# Patient Record
Sex: Male | Born: 2000 | Race: White | Hispanic: No | Marital: Single | Smoking: Never smoker
Health system: Southern US, Community
[De-identification: ages and names within clinical notes are randomized; demographics above are authoritative.]

## PROBLEM LIST (undated history)

## (undated) DIAGNOSIS — L309 Dermatitis, unspecified: Secondary | ICD-10-CM

## (undated) DIAGNOSIS — E063 Autoimmune thyroiditis: Secondary | ICD-10-CM

## (undated) DIAGNOSIS — E669 Obesity, unspecified: Secondary | ICD-10-CM

## (undated) DIAGNOSIS — E301 Precocious puberty: Secondary | ICD-10-CM

## (undated) DIAGNOSIS — R7303 Prediabetes: Secondary | ICD-10-CM

## (undated) DIAGNOSIS — Z8489 Family history of other specified conditions: Secondary | ICD-10-CM

## (undated) DIAGNOSIS — I10 Essential (primary) hypertension: Secondary | ICD-10-CM

## (undated) DIAGNOSIS — J329 Chronic sinusitis, unspecified: Secondary | ICD-10-CM

## (undated) HISTORY — DX: Prediabetes: R73.03

## (undated) HISTORY — DX: Obesity, unspecified: E66.9

---
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
## 2021-01-02 ENCOUNTER — Ambulatory Visit (INDEPENDENT_AMBULATORY_CARE_PROVIDER_SITE_OTHER): Payer: 59 | Admitting: Gastroenterology

## 2021-01-02 ENCOUNTER — Encounter: Payer: Self-pay | Admitting: Gastroenterology

## 2021-01-02 VITALS — BP 138/74 | HR 103 | Ht 71.0 in | Wt 314.4 lb

## 2021-01-02 DIAGNOSIS — K760 Fatty (change of) liver, not elsewhere classified: Secondary | ICD-10-CM | POA: Diagnosis not present

## 2021-01-02 DIAGNOSIS — K219 Gastro-esophageal reflux disease without esophagitis: Secondary | ICD-10-CM | POA: Diagnosis not present

## 2021-01-02 NOTE — Progress Notes (Signed)
[facility] Progress Note  Chief Complaint: GERD and fatty liver  Subjective  History: Seen in consult 10/21/2020 for chronic GERD and elevated LFTs with imaging showing fatty liver.  Patient was started on pantoprazole 40 mg a day, diet and lifestyle changes recommended both the GERD and fatty liver.  Justin [name] has been feeling better since we last saw him.  Daily pantoprazole has resolved his heartburn.  He does not feel regurgitation and denies dysphagia.  He is trying to make improvements in his diet and lifestyle.  He is also planning to see a rheumatologist soon because of his elevated ANA and family history of lupus.  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  Incidentally, he is heading to [facility] [employer] in [place], [place] next year and will play lacrosse there as well.  ROS: Cardiovascular:  no chest pain Respiratory: no dyspnea No rash  The patient's Past Medical, Family and Social History were reviewed and are on file in the EMR.  Objective:  Med list reviewed  Current Outpatient Medications:  .  albuterol (PROAIR HFA) 108 (90 Base) MCG/ACT inhaler, USE 2 INHALATIONS BY MOUTH  EVERY 4 HOURS AS NEEDED FOR WHEEZING OR SHORTNESS OF  BREATH, Disp: 54 g, Rfl: 2 .  albuterol (PROVENTIL) (2.5 MG/3ML) 0.083% nebulizer solution, USE 1 VIAL VIA NEBULIZER  EVERY 6 HOURS AS NEEDED FOR WHEEZING OR SHORTNESS OF  BREATH, Disp: 450 mL, Rfl: 0 .  ARNUITY ELLIPTA 200 MCG/ACT AEPB, USE 1 INHALATION BY MOUTH  DAILY. RINSE, GARGLE AND  SPIT AFTER USE, Disp: 90 each, Rfl: 10 .  azelastine (ASTELIN) 0.1 % nasal spray, Place 2 sprays into both nostrils 2 (two) times daily., Disp: 90 mL, Rfl: 2 .  calcium-vitamin D (OSCAL WITH D) 500-200 MG-UNIT tablet, Take 1 tablet by mouth daily., Disp: , Rfl:  .  desonide (DESOWEN) 0.05 % cream, APPLY TOPICALLY TWICE DAILY, Disp: 180 g, Rfl: 2 .  levothyroxine  (SYNTHROID) 150 MCG tablet, Take 1 tablet (150 mcg total) by mouth daily., Disp: 90 tablet, Rfl: 3 .  lisinopril (ZESTRIL) 5 MG tablet, Take 1 tablet (5 mg total) by mouth daily., Disp: 90 tablet, Rfl: 3 .  loratadine (CLARITIN) 10 MG tablet, Take 1 tablet (10 mg total) by mouth 2 (two) times daily., Disp: 180 tablet, Rfl: 2 .  loteprednol (LOTEMAX) 0.2 % SUSP, Place 1 drop into both eyes 2 (two) times daily., Disp: 3 Bottle, Rfl: 2 .  olopatadine (PATANOL) 0.1 % ophthalmic solution, INSTILL 1 DROP INTO BOTH  EYES TWICE DAILY IF NEEDED, Disp: 15 mL, Rfl: 10 .  pantoprazole (PROTONIX) 40 MG tablet, Take 1 tablet (40 mg total) by mouth daily., Disp: 30 tablet, Rfl: 8 .  PATADAY 0.2 % SOLN, Place 1 drop into both eyes daily., Disp: 2.5 mL, Rfl: 5   Vital signs in last 24 hrs: Vitals:   01/02/21 1350  BP: 138/74  Pulse: (!) 103  SpO2: 98%   Wt Readings from Last 3 Encounters:  01/02/21 (!) 314 lb 6.4 oz (142.6 kg) (>99 %, Z= 3.18)*  11/07/20 (!) 304 lb 9.6 oz (138.2 kg) (>99 %, Z= 3.09)*  10/21/20 (!) 302 lb 6.4 oz (137.2 kg) (>99 %, Z= 3.07)*   * Growth percentiles are based on CDC (Boys, 2-20 Years) data.    Physical Exam  Well-appearing  HEENT: sclera anicteric, oral mucosa moist without  lesions  Neck: supple, no thyromegaly, JVD or lymphadenopathy  Cardiac: RRR without murmurs, S1S2 heard, no peripheral edema  Pulm: clear to auscultation bilaterally, normal RR and effort noted  Abdomen: soft, morbidly obese, no tenderness, with active bowel sounds. No guarding or palpable hepatosplenomegaly.  Skin; warm and dry, no jaundice or rash  Labs:  ESR 8, ANA positive at 1-3 20, hemoglobin A1c 5.6, smooth muscle antibody negative  Last LFTs normal 10/21/2020 ___________________________________________ Radiologic studies:   ____________________________________________ Other:   _____________________________________________ Assessment & Plan  Assessment: Encounter Diagnoses   Name Primary?  . Gastroesophageal reflux disease, unspecified whether esophagitis present Yes  . NAFLD (nonalcoholic fatty liver disease)    GERD with no red flag symptoms.  Much improved on once daily PPI. We discussed the limitation of acid suppression therapy, breadth of diet and lifestyle changes required for reflux control, the possibility of reflux related complications such as esophagitis, stricture or Barrett's esophagus.  Other anatomic considerations such as gastric outlet obstruction or hiatal hernia may contribute to reflux.  I offered a referral to the [facility] given his BMI and comorbidity of fatty liver.  He appreciated the offer and did not want to pursue that at this time.  He said he would talk with his mother some more about it  Hopefully the chronic PPI use can be a bridge to better improvement in his GERD symptoms through diet and lifestyle changes, especially significant weight loss.  We will see him again as needed.  20 minutes were spent on this encounter (including chart review, history/exam, counseling/coordination of care, and documentation) > 50% of that time was spent on counseling and coordination of care.  Topics discussed included: GERD and fatty liver.  Nelida Meuse III

## 2021-01-02 NOTE — Patient Instructions (Signed)
If you are age 19 or older, your body mass index should be between 23-30. Your Body mass index is 43.85 kg/m. If this is out of the aforementioned range listed, please consider follow up with your Primary Care Provider.  If you are age 31 or younger, your body mass index should be between 19-25. Your Body mass index is 43.85 kg/m. If this is out of the aformentioned range listed, please consider follow up with your Primary Care Provider.   It was a pleasure to see you today!  Dr. Myrtie Neither

## 2021-05-06 ENCOUNTER — Other Ambulatory Visit: Payer: Self-pay | Admitting: Allergy and Immunology

## 2021-05-08 ENCOUNTER — Other Ambulatory Visit: Payer: Self-pay

## 2021-05-08 MED ORDER — ALBUTEROL SULFATE HFA 108 (90 BASE) MCG/ACT IN AERS: INHALATION_SPRAY | RESPIRATORY_TRACT | 2 refills | Status: DC

## 2021-06-09 ENCOUNTER — Other Ambulatory Visit: Payer: Self-pay | Admitting: Allergy and Immunology

## 2021-06-24 ENCOUNTER — Encounter: Payer: Self-pay | Admitting: Allergy and Immunology

## 2021-06-24 ENCOUNTER — Other Ambulatory Visit: Payer: Self-pay

## 2021-06-24 ENCOUNTER — Ambulatory Visit (INDEPENDENT_AMBULATORY_CARE_PROVIDER_SITE_OTHER): Payer: 59 | Admitting: Allergy and Immunology

## 2021-06-24 VITALS — BP 130/72 | HR 97 | Temp 98.2°F | Resp 16 | Ht 71.0 in | Wt 321.0 lb

## 2021-06-24 DIAGNOSIS — J3089 Other allergic rhinitis: Secondary | ICD-10-CM | POA: Diagnosis not present

## 2021-06-24 DIAGNOSIS — H1013 Acute atopic conjunctivitis, bilateral: Secondary | ICD-10-CM

## 2021-06-24 DIAGNOSIS — H1010 Acute atopic conjunctivitis, unspecified eye: Secondary | ICD-10-CM

## 2021-06-24 DIAGNOSIS — J301 Allergic rhinitis due to pollen: Secondary | ICD-10-CM | POA: Diagnosis not present

## 2021-06-24 DIAGNOSIS — L2089 Other atopic dermatitis: Secondary | ICD-10-CM

## 2021-06-24 DIAGNOSIS — J4530 Mild persistent asthma, uncomplicated: Secondary | ICD-10-CM | POA: Diagnosis not present

## 2021-06-24 MED ORDER — LORATADINE 10 MG PO TABS: 10.0000 mg | ORAL_TABLET | Freq: Two times a day (BID) | ORAL | 2 refills | Status: DC

## 2021-06-24 MED ORDER — ARNUITY ELLIPTA 200 MCG/ACT IN AEPB: INHALATION_SPRAY | RESPIRATORY_TRACT | 5 refills | Status: DC

## 2021-06-24 MED ORDER — ALBUTEROL SULFATE HFA 108 (90 BASE) MCG/ACT IN AERS: INHALATION_SPRAY | RESPIRATORY_TRACT | 2 refills | Status: DC

## 2021-06-24 MED ORDER — MOMETASONE FUROATE 0.1 % EX OINT: TOPICAL_OINTMENT | Freq: Every day | CUTANEOUS | 3 refills | Status: DC

## 2021-06-24 NOTE — Progress Notes (Signed)
[place] - [place] - [place] - [place] - [place]   Follow-up Note  Referring Provider: [name], MD Primary Provider: [name], MD Date of Office Visit: 06/24/2021  Subjective:   Justin [name] (DOB: 05-05-2001) is a 20 y.o. male who [facility] in re-evaluation of the following:  HPI: Justin [name] returns to this clinic in reevaluation of asthma and allergic rhinitis and atopic dermatitis.  His last visit to this clinic was 27 August 2020.  He has really done well with his asthma and basically his entire airway is under excellent control with his current plan and he has not required a systemic steroid or antibiotic and he rarely uses a short acting bronchodilator and he can exercise without any problem at all.  He has been having some problems with his skin.  He is now working at [employer] and is developing a problem on his forearms and his popliteal fossa.  He apparently was exposed to a fair amount of chemicals while working at [employer].  His recent outbreak has been about 4 months or so.  He has been applying some mupirocin to his right wrist and it does appear to respond to some degree.  He is not using any topical steroid.  Allergies as of 06/24/2021       Reactions   Adhesive [tape] Rash   EXACERBATES ECZEMA        Medication List    albuterol (2.5 MG/3ML) 0.083% nebulizer solution Commonly known as: PROVENTIL USE 1 VIAL VIA NEBULIZER  EVERY 6 HOURS AS NEEDED FOR WHEEZING OR SHORTNESS OF  BREATH   albuterol 108 (90 Base) MCG/ACT inhaler Commonly known as: ProAir HFA USE 2 INHALATIONS BY MOUTH  EVERY 4 HOURS AS NEEDED FOR WHEEZING OR SHORTNESS OF  BREATH   Arnuity Ellipta 200 MCG/ACT Aepb Generic drug: Fluticasone Furoate USE 1 INHALATION BY MOUTH  DAILY. RINSE, GARGLE AND  SPIT AFTER USE   calcium-vitamin D 500-200 MG-UNIT tablet Commonly known as: OSCAL WITH D Take 1 tablet by mouth daily.    desonide 0.05 % cream Commonly known as: DESOWEN APPLY TOPICALLY TWICE DAILY   levothyroxine 150 MCG tablet Commonly known as: SYNTHROID Take 1 tablet (150 mcg total) by mouth daily.   lisinopril 5 MG tablet Commonly known as: ZESTRIL Take 1 tablet (5 mg total) by mouth daily.   loratadine 10 MG tablet Commonly known as: CLARITIN Take 1 tablet (10 mg total) by mouth 2 (two) times daily.   loteprednol 0.2 % Susp Commonly known as: LOTEMAX Place 1 drop into both eyes 2 (two) times daily.   pantoprazole 40 MG tablet Commonly known as: PROTONIX Take 1 tablet (40 mg total) by mouth daily.   Pataday 0.2 % Soln Generic drug: Olopatadine HCl Place 1 drop into both eyes daily.   olopatadine 0.1 % ophthalmic solution Commonly known as: PATANOL INSTILL 1 DROP INTO BOTH  EYES TWICE DAILY IF NEEDED        Past Medical History:  Diagnosis Date   Asthma    daily inhaler, prn neb./inhaler   Eczema    Family history of adverse reaction to anesthesia    pt's father had hx. of being hard to wake up post-op   Hashimoto's disease    Hypertension    under control with med., has been on med. x 1 yr.   Obesity    Precocious puberty 10/2016   Prediabetes    Sinus infection  10/20/2016   started antibiotic 10/14/2016 x 10 days    Past Surgical History:  Procedure Laterality Date   MYRINGOTOMY WITH TUBE PLACEMENT  05/24/2003   SUPPRELIN IMPLANT Left 02/07/2015   Procedure: SUPPRELIN IMPLANT;  Surgeon: Judie Petit. Leonia Corona, MD;  Location: [facility];  Service: Pediatrics;  Laterality: Left;   SUPPRELIN IMPLANT  2015   SUPPRELIN IMPLANT Left 10/26/2016   Procedure: REMOVE SUPPRELIN IMPLANT;  Surgeon: Kandice Hams, MD;  Location: [facility];  Service: Pediatrics;  Laterality: Left;   SUPPRELIN REMOVAL Left 02/07/2015   Procedure: SUPPRELIN REMOVAL;  Surgeon: Judie Petit. Leonia Corona, MD;  Location: [facility];  Service: Pediatrics;  Laterality: Left;    TOOTH EXTRACTION Bilateral 01/02/2015   Procedure: SURGICAL REMOVAL OF TEETH--1,16,17,32;  Surgeon: Hinton Dyer, DDS;  Location: [facility];  Service: Oral Surgery;  Laterality: Bilateral;    Review of systems negative except as noted in HPI / PMHx or noted below:  Review of Systems  Constitutional: Negative.   HENT: Negative.    Eyes: Negative.   Respiratory: Negative.    Cardiovascular: Negative.   Gastrointestinal: Negative.   Genitourinary: Negative.   Musculoskeletal: Negative.   Skin: Negative.   Neurological: Negative.   Endo/Heme/Allergies: Negative.   Psychiatric/Behavioral: Negative.      Objective:   Vitals:   06/24/21 1337  BP: 130/72  Pulse: 97  Resp: 16  Temp: 98.2 F (36.8 C)  SpO2: 99%   Height: 5\' 11"  (180.3 cm)  Weight: (!) 321 lb (145.6 kg)   Physical Exam Constitutional:      Appearance: He is not diaphoretic.  HENT:     Head: Normocephalic.     Right Ear: Tympanic membrane, ear canal and external ear normal.     Left Ear: Tympanic membrane, ear canal and external ear normal.     Nose: Nose normal. No mucosal edema or rhinorrhea.     Mouth/Throat:     Pharynx: Uvula midline. No oropharyngeal exudate.  Eyes:     Conjunctiva/sclera: Conjunctivae normal.  Neck:     Thyroid: No thyromegaly.     Trachea: Trachea normal. No tracheal tenderness or tracheal deviation.  Cardiovascular:     Rate and Rhythm: Normal rate and regular rhythm.     Heart sounds: Normal heart sounds, S1 normal and S2 normal. No murmur heard. Pulmonary:     Effort: No respiratory distress.     Breath sounds: Normal breath sounds. No stridor. No wheezing or rales.  Lymphadenopathy:     Head:     Right side of head: No tonsillar adenopathy.     Left side of head: No tonsillar adenopathy.     Cervical: No cervical adenopathy.  Skin:    Findings: Rash (Blotchy erythematous patches forearms right popliteal fossa.  7 x 7 cm erythematous lichenified patch  dorsal surface right wrist) present. No erythema.     Nails: There is no clubbing.  Neurological:     Mental Status: He is alert.    Diagnostics:    Spirometry was performed and demonstrated an FEV1 of 4.44 at 93 % of predicted.  Assessment and Plan:   1. Asthma, well controlled, mild persistent   2. Perennial allergic rhinitis   3. Seasonal allergic rhinitis due to pollen   4. Seasonal allergic conjunctivitis   5. Other atopic dermatitis    1. Continue to Treat inflammation:    A. Arnuity 200- 1 inhalation 3-7 times a week  B. Flonase 1-2 sprays each nostril 3-7 times a week  C. Montelukast 10 mg - 1 tablet 3-7 times a week  2. If needed:   A. Proventil HFA 2 puffs every 4-6 hours  B. loratadine 10 mg 1- 2 tablets one time per day  C. Pataday 1 drop each eye once a day  D. Mometasone 0.1% ointment applied to inflamed skin 1-2 times per day  3. Obtain fall flu vaccine  4. Return to clinic in 12 months or earlier if problem   Justin [name] has done very well with his airway and I think there is an opportunity to consolidate his medical treatment and he can use his anti-inflammatory agents for his airway somewhere between 3-7 times per week depending on disease activity.  His skin appears to be flared and this is probably secondary to chemical exposure at his place of employment and we will get him to use mometasone 0.1% ointment applied to his skin until this resolves.  Should he not respond to this agent especially regarding the dorsal right wrist lesion then we need to consider the possibility has a secondary fungal infection.  If he does well I will see him back in this clinic in 12 months or earlier if there is a problem.   Laurette Schimke, MD Allergy / Immunology [facility]

## 2021-06-24 NOTE — Patient Instructions (Signed)
  1. Continue to Treat inflammation:    A. Arnuity 200- 1 inhalation 3-7 times a week  B. Flonase 1-2 sprays each nostril 3-7 times a week  C. Montelukast 10 mg - 1 tablet 3-7 times a week  2. If needed:   A. Proventil HFA 2 puffs every 4-6 hours  B. loratadine 10 mg 1- 2 tablets one time per day  C. Pataday 1 drop each eye once a day  D.  Mometasone 0.1% ointment applied to inflamed skin 1-2 times per day  3. Obtain fall flu vaccine  4. Return to clinic in 12 months or earlier if problem

## 2021-06-25 ENCOUNTER — Encounter: Payer: Self-pay | Admitting: Allergy and Immunology

## 2021-06-26 ENCOUNTER — Other Ambulatory Visit: Payer: Self-pay | Admitting: *Deleted

## 2021-06-26 MED ORDER — ARNUITY ELLIPTA 200 MCG/ACT IN AEPB: 1.0000 | INHALATION_SPRAY | Freq: Every day | RESPIRATORY_TRACT | 1 refills | Status: DC

## 2021-07-07 ENCOUNTER — Telehealth: Payer: Self-pay | Admitting: Gastroenterology

## 2021-07-07 MED ORDER — PANTOPRAZOLE SODIUM 40 MG PO TBEC: 40.0000 mg | DELAYED_RELEASE_TABLET | Freq: Every day | ORAL | 1 refills | Status: AC

## 2021-07-07 NOTE — Telephone Encounter (Signed)
Please send a 90 day prescription for once daily therapy and one refill to his preferred pharmacy.  - HD

## 2021-07-07 NOTE — Telephone Encounter (Signed)
Please advise on amount of refills that can be provided

## 2021-07-07 NOTE — Telephone Encounter (Signed)
Refilled as directed for a 90 days supply with 1 refill.

## 2021-07-09 ENCOUNTER — Other Ambulatory Visit: Payer: Self-pay | Admitting: Allergy and Immunology

## 2021-07-19 ENCOUNTER — Other Ambulatory Visit: Payer: Self-pay | Admitting: Allergy and Immunology

## 2021-07-22 ENCOUNTER — Ambulatory Visit (INDEPENDENT_AMBULATORY_CARE_PROVIDER_SITE_OTHER): Payer: 59 | Admitting: Pediatric Endocrinology

## 2021-07-22 ENCOUNTER — Other Ambulatory Visit: Payer: Self-pay

## 2021-07-22 ENCOUNTER — Encounter (INDEPENDENT_AMBULATORY_CARE_PROVIDER_SITE_OTHER): Payer: Self-pay | Admitting: Pediatric Endocrinology

## 2021-07-22 VITALS — BP 110/60 | HR 84 | Wt 327.4 lb

## 2021-07-22 DIAGNOSIS — E559 Vitamin D deficiency, unspecified: Secondary | ICD-10-CM | POA: Diagnosis not present

## 2021-07-22 DIAGNOSIS — R7303 Prediabetes: Secondary | ICD-10-CM

## 2021-07-22 DIAGNOSIS — E063 Autoimmune thyroiditis: Secondary | ICD-10-CM

## 2021-07-22 DIAGNOSIS — E038 Other specified hypothyroidism: Secondary | ICD-10-CM

## 2021-07-22 LAB — POCT GLYCOSYLATED HEMOGLOBIN (HGB A1C): Hemoglobin A1C: 5.7 % — AB (ref 4.0–5.6)

## 2021-07-22 LAB — POCT GLUCOSE (DEVICE FOR HOME USE): POC Glucose: 147 mg/dl — AB (ref 70–99)

## 2021-07-22 MED ORDER — LEVOTHYROXINE SODIUM 150 MCG PO TABS: 150.0000 ug | ORAL_TABLET | Freq: Every day | ORAL | 3 refills | Status: DC

## 2021-07-22 NOTE — Progress Notes (Signed)
Subjective:  Subjective  Patient Name: Christopher Glasscock Date of Birth: 2001-09-19  MRN: [id number]  Michae Grimley  presents to clinic today for follow-up evaluation and management  of his hypothyroidism, prediabetes, and obesity  HISTORY OF PRESENT ILLNESS:   Darnel is a 20 y.o. Caucasian male .  Haruki was unaccompanied   1. [name] was first seen in our clinic 07/16/11 for evaluation and management of hypothyroidism and obesity. The patient was 9[birth year]11[birth year] years old. The patient had developed obesity gradually, but progressively for several years. Breast tissue has developed more recently. He has also had problems with excess hunger and reflux. He is taking both Riomet and Prevacid. [name] diagnosed him with hypothyroidism in March 2012 and started him on  levothyroxine, 25 mcg/day on 03/03/11. The child has not had thyroid surgery or neck irradiation. He has had problems with asthma and allergies for many years. His sister has similar problems with obesity, dyspepsia, GERD, and hypothryoidism secondary to thyroiditis.  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  It was replaced February 07, 2015. It was removed on 10/26/16.     2. The patient's last [facility] visit was on 11/07/20. In the interim, he has been generally healthy. He is on pantoprazole and no longer having reflux.    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 He is lifting 3-4 days a week around working more than full time at [employer]. He is saving money for school.    Thyroid  - He is taking his Synthroid 150 mcg daily.  - He has not been forgetting doses.  - He is taking it in morning-  - His energy level is good - He has been weight training at home 3-4 days a week.  - No constipation or diarrhea - No temperature  dysregulation - Weight gain muscle mass  Blood pressure - He is taking Lisinopril daily  He feels that it is working.  - He does not have a cuff at home.   Prediabetes - No longer taking Metformin - Feels that weight gain is largely muscle mass - He is drinking lots of water  Hypovitamin D - He has switched to gummy vit d and doesn't feel like he refluxes anymore. He is taking 2000 (50 mcg) IU per day.   He feels like he is still growing  3. Pertinent Review of Systems:   Constitutional: The patient feels "great". The patient seems healthy and active.  Eyes: Vision seems to be good. There are no recognized eye problems. Ophthalmology evaluation today (07/22/21) with retinal imaging- no issues.  Neck: There are no recognized problems of the anterior neck.  Heart: There are no recognized heart problems. The ability to play and do other physical activities seems normal.  Lungs: Asthma well controlled. - he has been having seasonal allergies but no major lung concerns.  Gastrointestinal: Bowel movents seem normal. There are no recognized GI problems.  Legs: Muscle mass and strength seem normal. The child can play and perform other physical activities without obvious discomfort. No edema is noted. Growing pains occasionally   Feet: There are no obvious foot problems. No edema is noted. Neurologic: There are no recognized problems with muscle movement and strength, sensation, or coordination.    PAST MEDICAL,  FAMILY, AND SOCIAL HISTORY  Past Medical History:  Diagnosis Date   Asthma    daily inhaler, prn neb./inhaler   Eczema    Family history of adverse reaction to anesthesia    pt's father had hx. of being hard to wake up post-op   Hashimoto's disease    Hypertension    under control with med., has been on med. x 1 yr.   Obesity    Precocious puberty 10/2016   Prediabetes    Sinus infection 10/20/2016   started antibiotic 10/14/2016 x 10 days    Family History  Problem  Relation Age of Onset   Diabetes Mother    Hypertension Father    Anesthesia problems Father        hard to wake up post-op   Asthma Father    Colon cancer Father    Asthma Sister    Heart disease Maternal Grandfather    Asthma Brother    Heart disease Maternal Grandmother    Heart disease Paternal Grandmother    Heart disease Paternal Grandfather    Liver disease Neg Hx    Esophageal cancer Neg Hx    Pancreatic cancer Neg Hx    Stomach cancer Neg Hx      Current Outpatient Medications:    albuterol (PROAIR HFA) 108 (90 Base) MCG/ACT inhaler, USE 2 INHALATIONS BY MOUTH  EVERY 4 HOURS AS NEEDED FOR WHEEZING OR SHORTNESS OF  BREATH, Disp: 54 g, Rfl: 2   albuterol (PROVENTIL) (2.5 MG/3ML) 0.083% nebulizer solution, USE 1 VIAL VIA NEBULIZER  EVERY 6 HOURS AS NEEDED FOR WHEEZING OR SHORTNESS OF  BREATH, Disp: 450 mL, Rfl: 0   Azelastine HCl 137 MCG/SPRAY SOLN, Place into both nostrils., Disp: , Rfl:    calcium-vitamin D (OSCAL WITH D) 500-200 MG-UNIT tablet, Take 1 tablet by mouth daily., Disp: , Rfl:    desonide (DESOWEN) 0.05 % cream, APPLY TOPICALLY TWICE DAILY, Disp: 180 g, Rfl: 1   fluticasone (FLONASE) 50 MCG/ACT nasal spray, USE 1 TO 2 SPRAYS IN BOTH  NOSTRILS ONCE DAILY AS  DIRECTED, Disp: 48 g, Rfl: 1   Fluticasone Furoate (ARNUITY ELLIPTA) 200 MCG/ACT AEPB, Inhale 1 puff into the lungs daily. Use as directed by Dr. Lucie LeatherKozlow., Disp: 90 each, Rfl: 1   lisinopril (ZESTRIL) 5 MG tablet, Take 1 tablet (5 mg total) by mouth daily., Disp: 90 tablet, Rfl: 3   loratadine (CLARITIN) 10 MG tablet, TAKE 1 TABLET(10 MG) BY MOUTH TWICE DAILY, Disp: 180 tablet, Rfl: 2   loteprednol (LOTEMAX) 0.2 % SUSP, Place 1 drop into both eyes 2 (two) times daily., Disp: 3 Bottle, Rfl: 2   mometasone (ELOCON) 0.1 % ointment, Apply topically daily. Apply 1-2 times a day as needed, Disp: 45 g, Rfl: 3   montelukast (SINGULAIR) 10 MG tablet, Take 10 mg by mouth daily., Disp: , Rfl:    olopatadine (PATANOL) 0.1 %  ophthalmic solution, INSTILL 1 DROP INTO BOTH  EYES TWICE DAILY IF NEEDED, Disp: 15 mL, Rfl: 10   pantoprazole (PROTONIX) 40 MG tablet, Take 1 tablet (40 mg total) by mouth daily., Disp: 90 tablet, Rfl: 1   PATADAY 0.2 % SOLN, Place 1 drop into both eyes daily., Disp: 2.5 mL, Rfl: 5   levothyroxine (SYNTHROID) 150 MCG tablet, Take 1 tablet (150 mcg total) by mouth daily., Disp: 90 tablet, Rfl: 3  Allergies as of 07/22/2021 - Review Complete 07/22/2021  Allergen Reaction Noted   Adhesive [tape] Rash 10/20/2016  reports that he has never smoked. He has never used smokeless tobacco. He reports that he does not drink alcohol and does not use drugs. Pediatric History  Patient Parents   Koo,Dana (Mother)   Other Topics Concern   Not on file  Social History Narrative   Freshman at [employer] and working at [employer]   Starting college next week- [employer] training/ [employer]   Primary Care Provider: [name], MD  ROS: There are no other significant problems involving Chelsea's other body systems.     Objective:  Objective  Vital Signs:      11/07/2020  BP 114/60  Pulse 82  Weight 304 lb 9.6 oz (A)  Height 5\' 11"  (1.803 m)  BMI (Calculated) 42.5    BP 110/60   Pulse 84   Wt (!) 327 lb 6.4 oz (148.5 kg)   BMI 45.66 kg/m   Blood pressure percentiles are not available for patients who are 18 years or older.  Ht Readings from Last 3 Encounters:  06/24/21 5\' 11"  (1.803 m) (70 %, Z= 0.53)*  01/02/21 5\' 11"  (1.803 m) (71 %, Z= 0.56)*  11/07/20 5\' 11"  (1.803 m) (72 %, Z= 0.57)*   * Growth percentiles are based on CDC (Boys, 2-20 Years) data.   Wt Readings from Last 3 Encounters:  07/22/21 (!) 327 lb 6.4 oz (148.5 kg) (>99 %, Z= 3.30)*  06/24/21 (!) 321 lb (145.6 kg) (>99 %, Z= 3.24)*  01/02/21 (!) 314 lb 6.4 oz (142.6 kg) (>99 %, Z= 3.18)*   * Growth percentiles are based on CDC (Boys, 2-20 Years) data.   HC Readings from Last 3  Encounters:  No data found for [facility]   Body surface area is 2.73 meters squared.  No height on file for this encounter. >99 %ile (Z= 3.30) based on CDC (Boys, 2-20 Years) weight-for-age data using vitals from 07/22/2021. No head circumference on file for this encounter.   PHYSICAL EXAM:    Constitutional: The patient appears healthy and well nourished. The patient's height and weight are advanced for age. He has gained weight since last visit Head: The head is normocephalic. Face: The face appears normal. There are no obvious dysmorphic features. Eyes: The eyes appear to be normally formed and spaced. Gaze is conjugate. There is no obvious arcus or proptosis. Moisture appears normal. Ears: The ears are normally placed and appear externally normal. Mouth: The oropharynx and tongue appear normal. Dentition appears to be advanced for age. Oral moisture is normal. Neck: The neck appears to be visibly normal. The thyroid gland is 14 grams in size. The consistency of the thyroid gland is normal. The thyroid gland is not tender to palpation. Lungs: The lungs are clear to auscultation. Air movement is good. Heart: Heart rate and rhythm are regular. Heart sounds S1 and S2 are normal. I did not appreciate any pathologic cardiac murmurs. Abdomen: The abdomen appears to be large in size for the patient's age. Bowel sounds are normal. There is no obvious hepatomegaly, splenomegaly, or other mass effect. + stretch marks Arms: Muscle size and bulk are normal for age. Hands: There is no obvious tremor. Phalangeal and metacarpophalangeal joints are normal. Palmar muscles are normal for age. Palmar skin is normal. Palmar moisture is also normal. Legs: Muscles appear normal for age. No edema is present. Feet: Feet are normally formed. Dorsalis pedal pulses are normal. Neurologic: Strength is normal for age in both the upper and lower extremities. Muscle tone is normal.  Sensation to touch is normal in both the  legs and feet.   +gynecomastia (improved)     LAB DATA:    Lab Results  Component Value Date   HGBA1C 5.7 (A) 07/22/2021   HGBA1C 5.6 11/07/2020   HGBA1C 5.7 (H) 07/09/2020   HGBA1C 5.5 07/06/2019   HGBA1C 5.9 (H) 11/23/2018   HGBA1C 5.9 (H) 08/09/2018   HGBA1C 5.8 04/05/2018   HGBA1C 5.9 (H) 11/23/2017      Results for orders placed or performed in visit on 07/22/21 (from the past 672 hour(s))  POCT glycosylated hemoglobin (Hb A1C)   Collection Time: 07/22/21  4:11 PM  Result Value Ref Range   Hemoglobin A1C 5.7 (A) 4.0 - 5.6 %   HbA1c POC (<> result, manual entry)     HbA1c, POC (prediabetic range)     HbA1c, POC (controlled diabetic range)    POCT Glucose (Device for Home Use)   Collection Time: 07/22/21  4:11 PM  Result Value Ref Range   Glucose Fasting, POC     POC Glucose 147 (A) 70 - 99 mg/dl         Assessment and Plan:  Assessment  ASSESSMENT: Eliel is a 20 y.o. Caucasian young man who is followed for hypothyroidism, precocious puberty (s/p treatment with Supprelin) and elevated A1C. He has also been managed for hypertension.     Prediabetes/obesity - Not currently on Metformin - A1C as above - Have discussed restarting Metformin for A1C 6% or higher - Has been doing less cardio this summer due to work. Will be starting [employer] at college in the coming weeks.  - Feels that weight gain is largely muscle- although he has been eating out more.   Hypothyroidism - Clinically euthyroid - Continues on 150 mcg of Synthroid (generic) daily - repeat labs today  Weight - Feels that he is working on muscle development - Expects to shed weight when he starts his full time workout schedule at school   Hypertension - BP well controlled on Lisinopril    PLAN:   1. Diagnostic:  TSH, Free T4, and Vit D levels drawn in August- will repeat today and once a year. A1C as above 2. Therapeutic: Continue Lisinopril.  Conitnue Synthroid 150 mcg daily. Continue Vit  D 2000 IU (50 mcg) daily.  3. Patient education: Reviewed changes since last visit. Discussed plans moving forward 4. Follow-up: Return in about 1 year (around 07/22/2022).  Dessa Phi, MD     Level of Service: >30 minutes spent today reviewing the medical chart, counseling the patient/family, and documenting today's encounter.

## 2021-07-23 LAB — TSH: TSH: 1.45 mIU/L (ref 0.50–4.30)

## 2021-07-23 LAB — VITAMIN D 25 HYDROXY (VIT D DEFICIENCY, FRACTURES): Vit D, 25-Hydroxy: 37 ng/mL (ref 30–100)

## 2021-07-23 LAB — T4, FREE: Free T4: 1.7 ng/dL — ABNORMAL HIGH (ref 0.8–1.4)

## 2021-09-18 IMAGING — DX DG ABDOMEN ACUTE W/ 1V CHEST
4 series · 4 of 4 positions shown · non-contrast
Comparison: None.

CLINICAL DATA: Hit by lacrosse ball in midline of chest and
abdomen, initial encounter

EXAM:
DG ABDOMEN ACUTE W/ 1V CHEST

[chest pa]
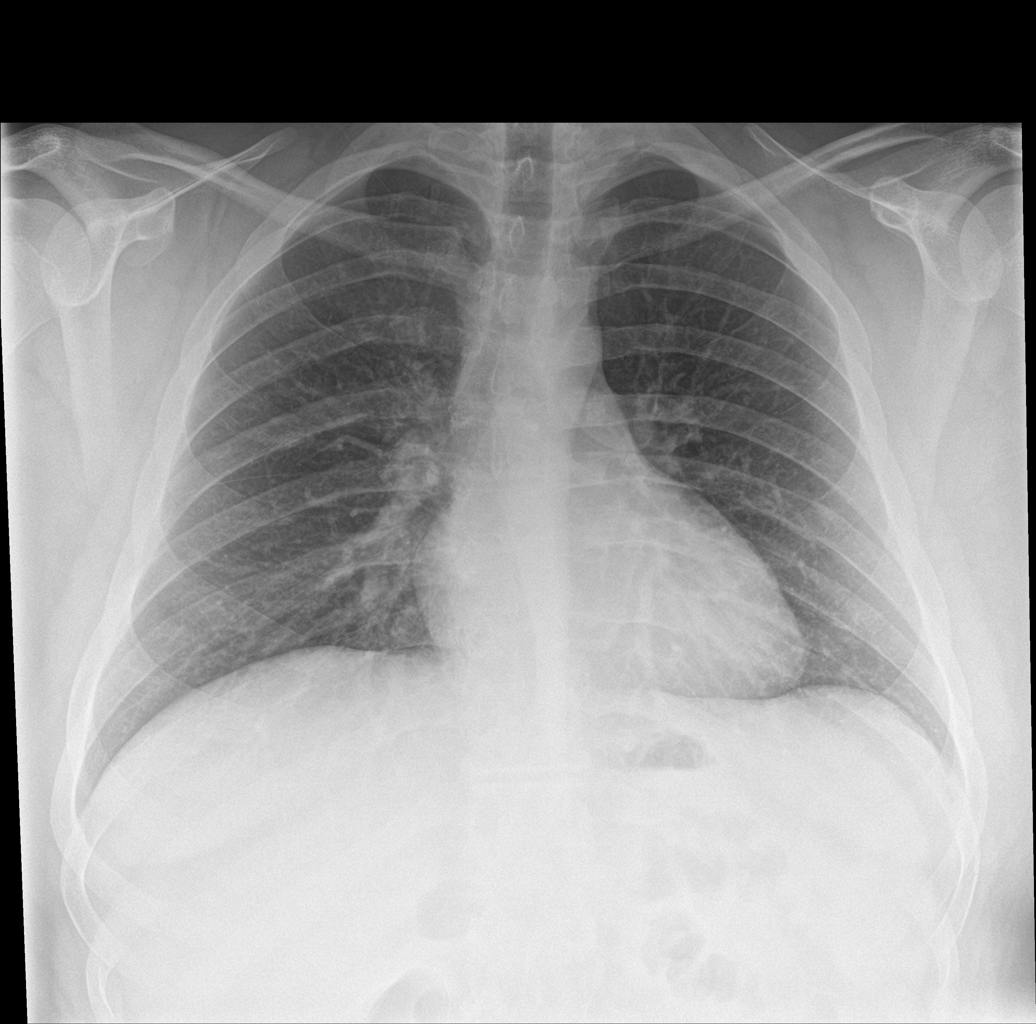

[abdomen erect]
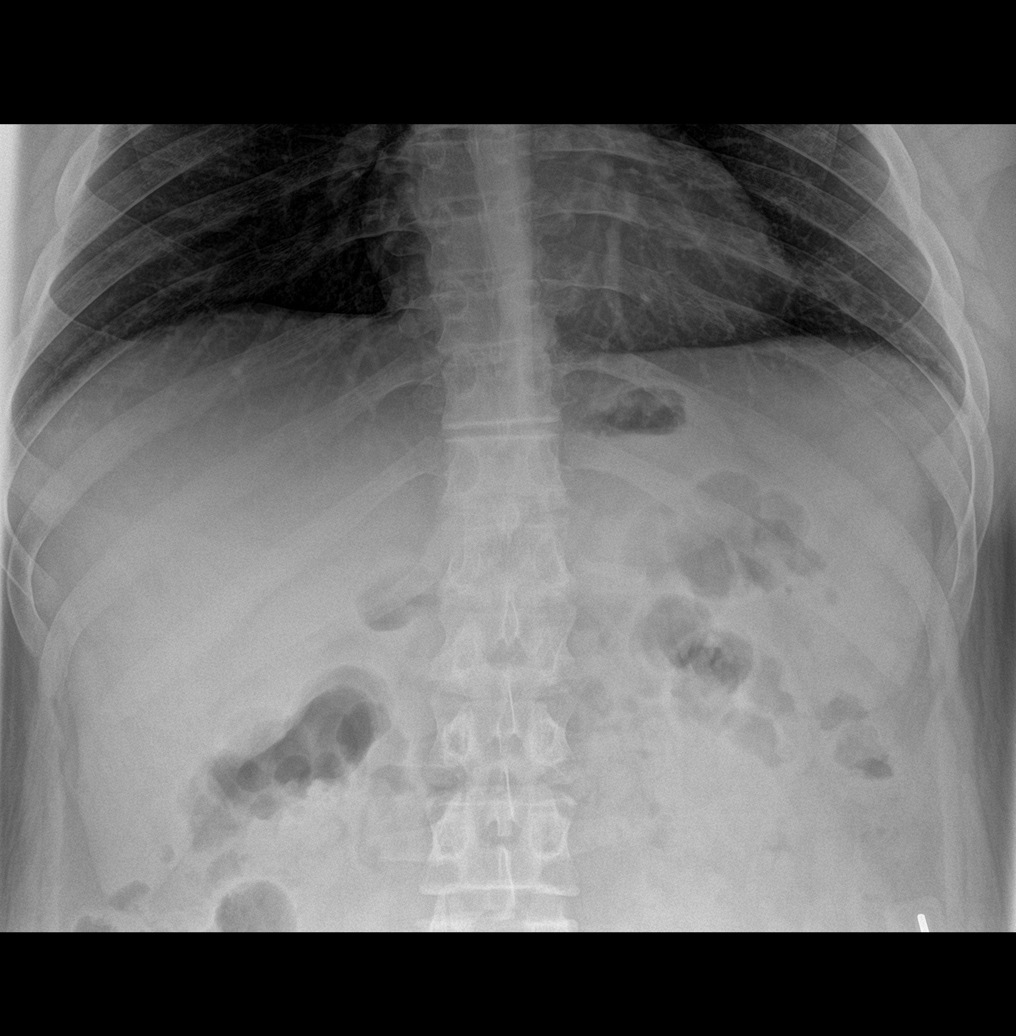

[abdomen supine (1 of 2)]
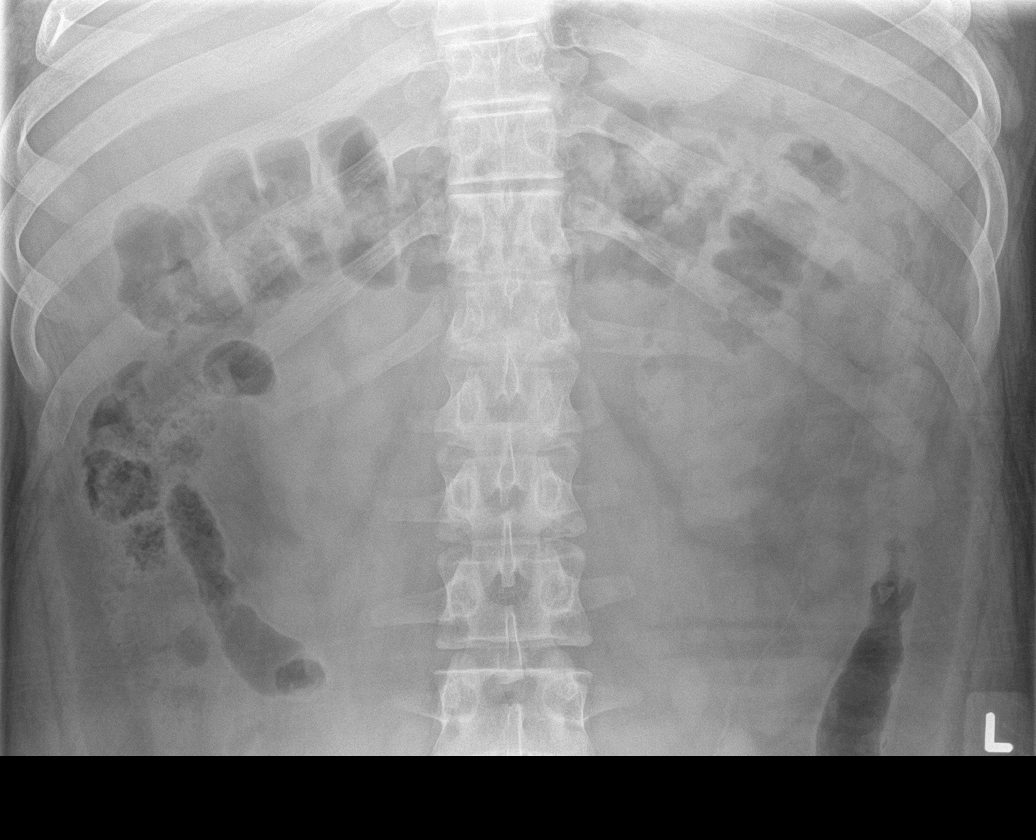

[abdomen supine (2 of 2)]
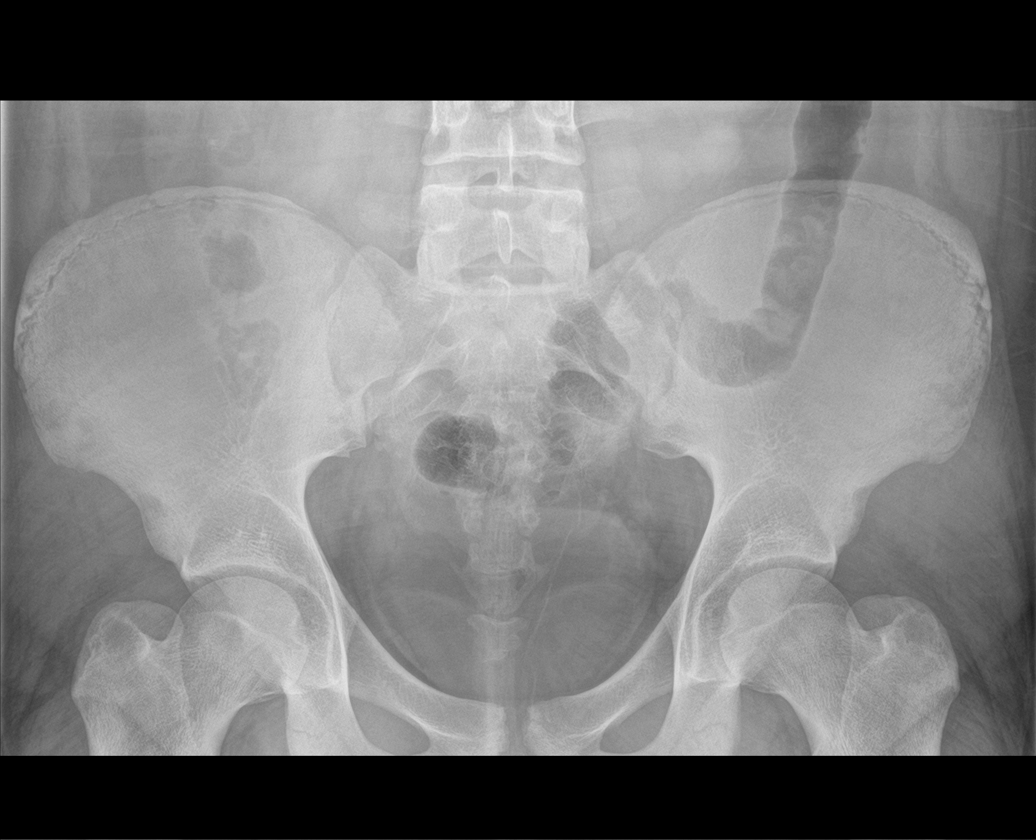

[4 of 4 positions shown; findings below may reference images not displayed]

FINDINGS: Cardiac shadows within normal limits. The lungs are clear. No
pneumothorax is seen. No effusion is noted.

Scattered large and small bowel gas is noted. No abnormal mass or
abnormal calcifications are seen. No acute bony abnormality is noted
in the chest or abdomen.
IMPRESSION: No acute abnormality noted.

## 2021-09-18 IMAGING — CT CT ABD-PELV W/ CM
2 of 4 series · 17 of 46 positions shown, 19 images · IV contrast (Omni 300)
Comparison: None.

CLINICAL DATA: Abdominal trauma

EXAM:
CT ABDOMEN AND PELVIS WITH CONTRAST
TECHNIQUE: Multidetector CT imaging of the abdomen and pelvis was performed
using the standard protocol following bolus administration of
intravenous contrast.
CONTRAST:  100mL OMNIPAQUE IOHEXOL 300 MG/ML  SOLN

[Series 3: a/p w/ 5mm · axial · 0.98mm/px · z∈[+632,+1177]mm · 14 of 119 slices shown, 16 images]
[im 5/119  soft-tissue]
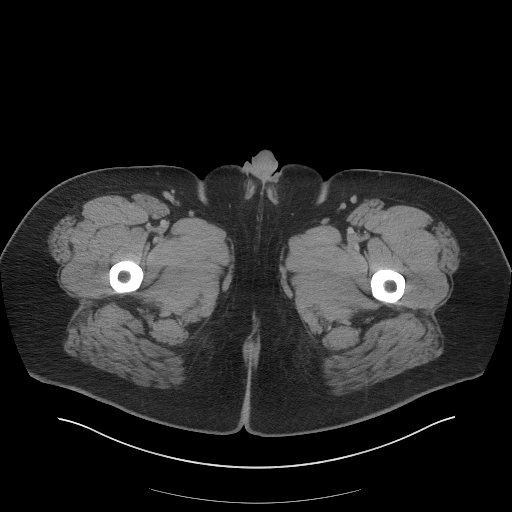
[im 5/119  bone]
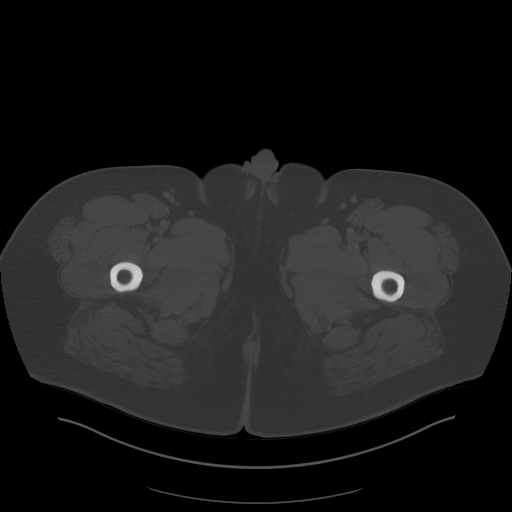
[im 15/119  soft-tissue]
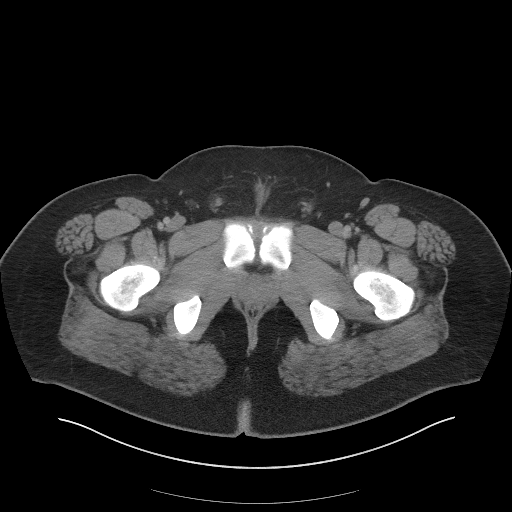
[im 25/119  soft-tissue]
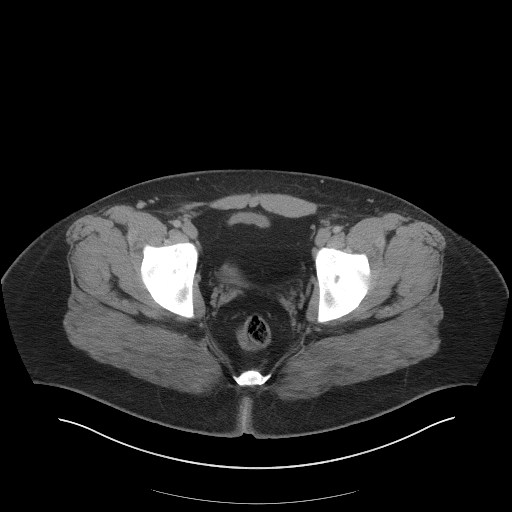
[im 30/119  soft-tissue]
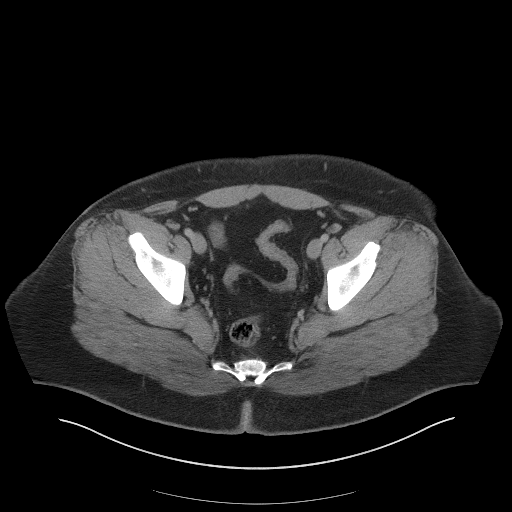
[im 40/119  soft-tissue]
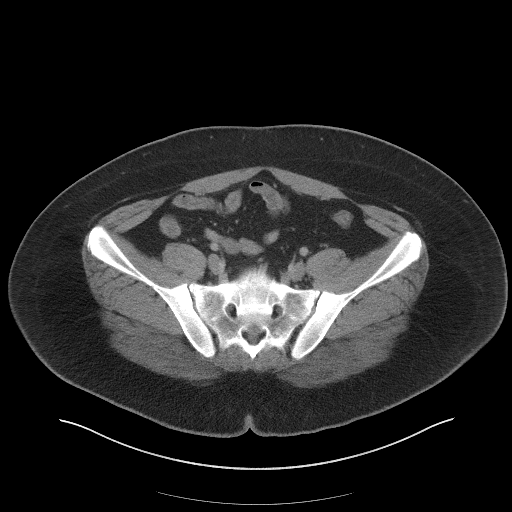
[im 50/119  soft-tissue]
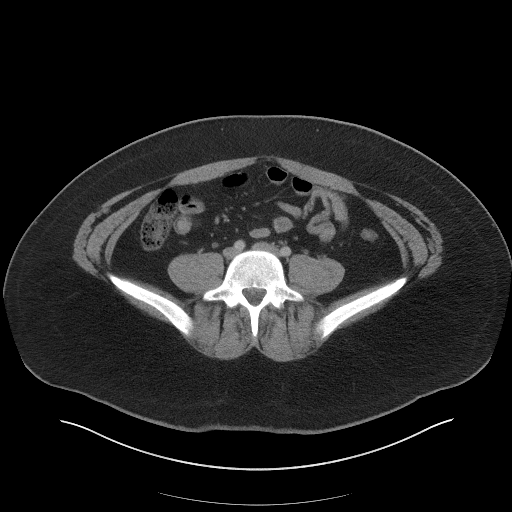
[im 55/119  soft-tissue]
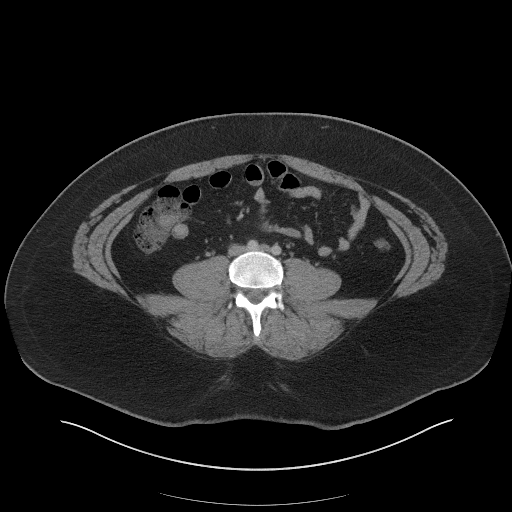
[im 64/119  soft-tissue]
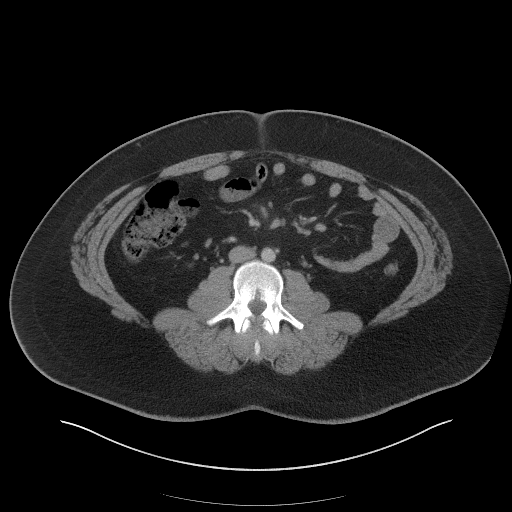
[im 69/119  soft-tissue]
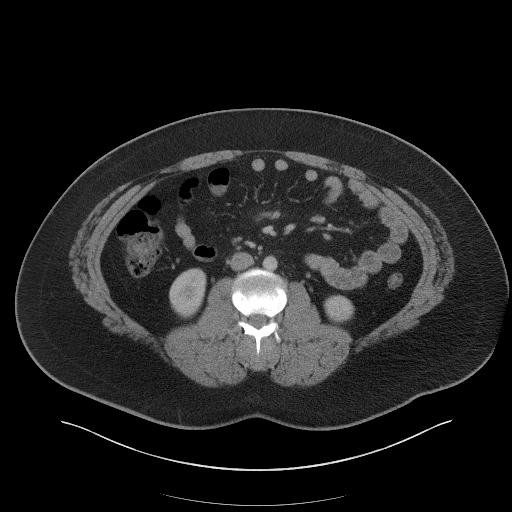
[im 69/119  bone]
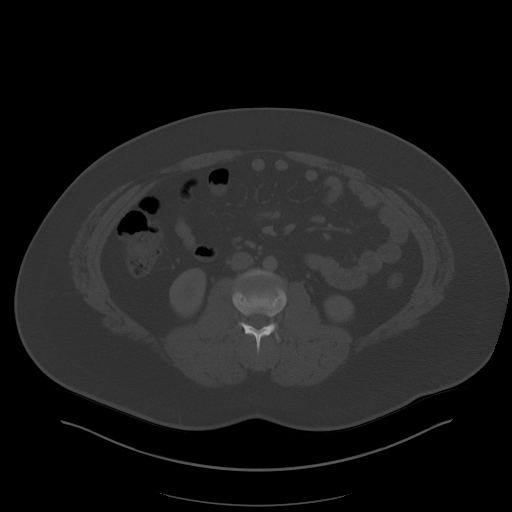
[im 79/119  soft-tissue]
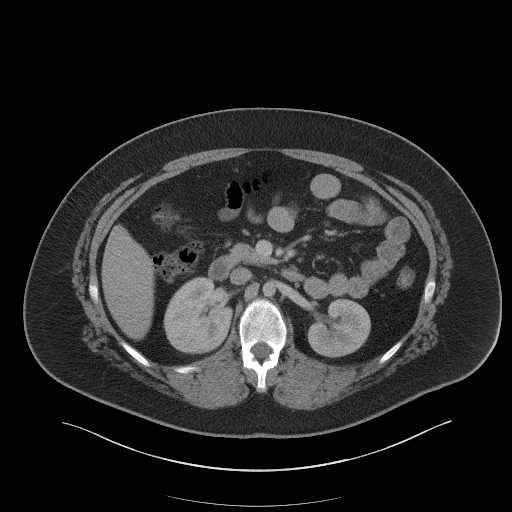
[im 89/119  soft-tissue]
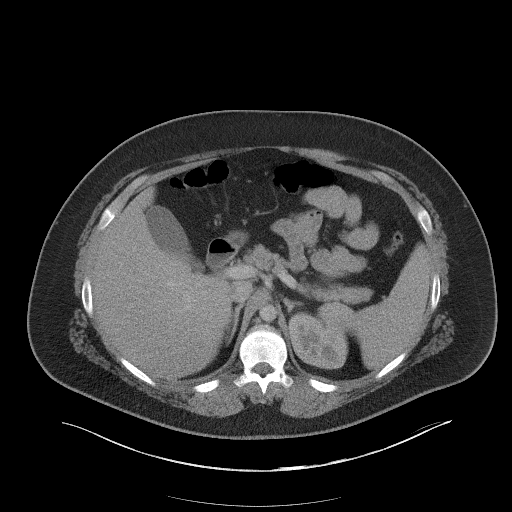
[im 94/119  soft-tissue]
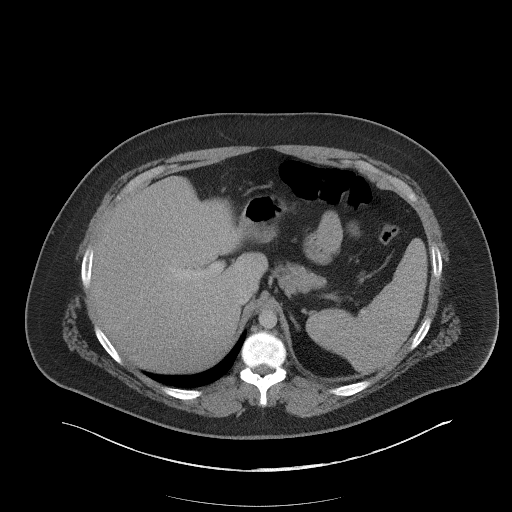
[im 104/119  soft-tissue]
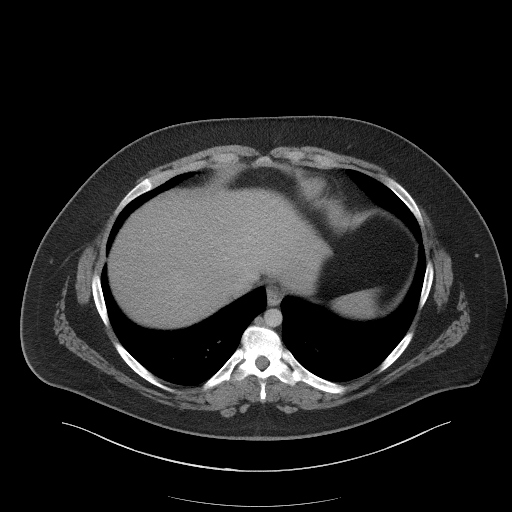
[im 114/119  soft-tissue]
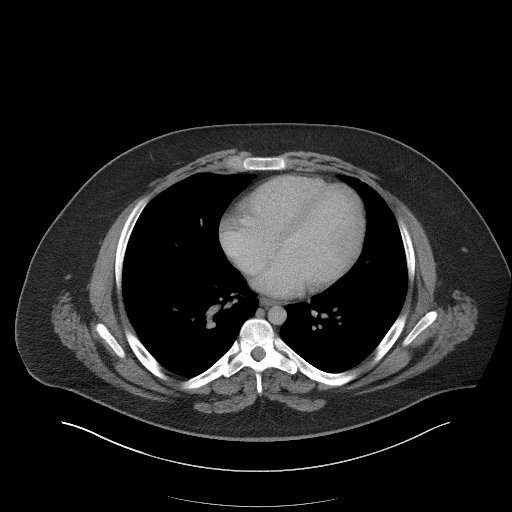

[Series 6: a/p w/ cor · coronal · 1.06mm/px · 3 of 182 slices shown]
[im 61/182  soft-tissue]
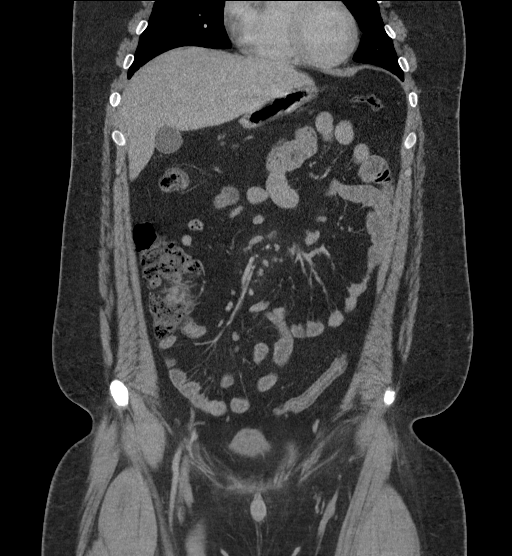
[im 81/182  soft-tissue]
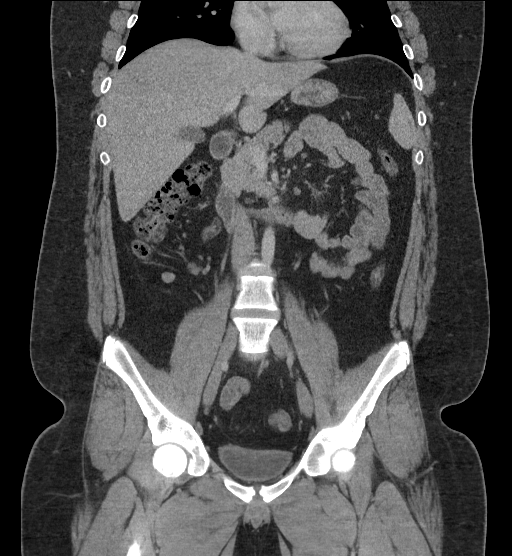
[im 101/182  soft-tissue]
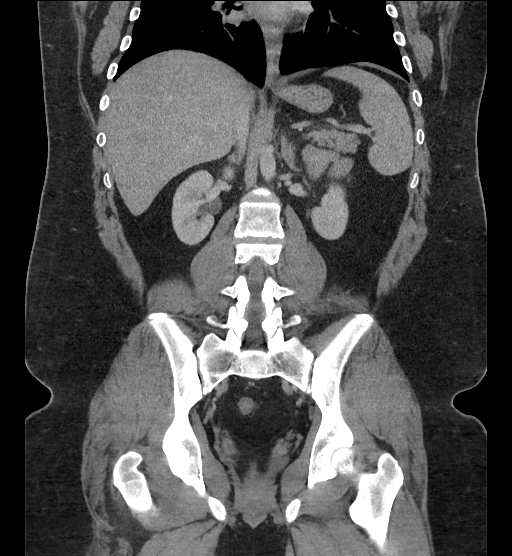

[17 of 46 positions shown; findings below may reference images not displayed]

FINDINGS: Lower chest: Lung bases are clear. No effusions. Heart is normal
size.

Hepatobiliary: Diffuse fatty infiltration of the liver. No focal
hepatic abnormality. No hepatic injury or perihepatic hematoma.
Gallbladder unremarkable.

Pancreas: No focal abnormality or ductal dilatation.

Spleen: No splenic injury or perisplenic hematoma.

Adrenals/Urinary Tract: No adrenal hemorrhage or renal injury
identified. Bladder is unremarkable.

Stomach/Bowel: Stomach, large and small bowel grossly unremarkable.

Vascular/Lymphatic: Aorta is normal caliber. Mildly prominent right
lower quadrant mesenteric lymph nodes. No retroperitoneal or
inguinal adenopathy.

Reproductive: No visible focal abnormality.

Other: No free fluid or free air.

Musculoskeletal: No acute bony abnormality.
IMPRESSION: Mild diffuse fatty infiltration of the liver.

Mildly prominent right lower quadrant mesenteric lymph nodes could
reflect mesenteric adenitis.

No evidence of significant traumatic injury in the abdomen or
pelvis.

## 2021-10-08 ENCOUNTER — Other Ambulatory Visit: Payer: Self-pay | Admitting: Allergy and Immunology

## 2021-11-08 ENCOUNTER — Other Ambulatory Visit (INDEPENDENT_AMBULATORY_CARE_PROVIDER_SITE_OTHER): Payer: Self-pay | Admitting: Pediatric Endocrinology

## 2021-11-11 DIAGNOSIS — S8012XA Contusion of left lower leg, initial encounter: Secondary | ICD-10-CM | POA: Insufficient documentation

## 2022-04-12 ENCOUNTER — Other Ambulatory Visit: Payer: Self-pay | Admitting: Allergy and Immunology

## 2022-06-16 ENCOUNTER — Ambulatory Visit (INDEPENDENT_AMBULATORY_CARE_PROVIDER_SITE_OTHER): Payer: 59 | Admitting: Allergy and Immunology

## 2022-06-16 ENCOUNTER — Encounter: Payer: Self-pay | Admitting: Allergy and Immunology

## 2022-06-16 VITALS — BP 138/88 | HR 92 | Temp 97.2°F | Resp 18 | Ht 73.0 in | Wt 304.6 lb

## 2022-06-16 DIAGNOSIS — J3089 Other allergic rhinitis: Secondary | ICD-10-CM

## 2022-06-16 DIAGNOSIS — J301 Allergic rhinitis due to pollen: Secondary | ICD-10-CM

## 2022-06-16 DIAGNOSIS — J4530 Mild persistent asthma, uncomplicated: Secondary | ICD-10-CM | POA: Diagnosis not present

## 2022-06-16 DIAGNOSIS — H1013 Acute atopic conjunctivitis, bilateral: Secondary | ICD-10-CM | POA: Diagnosis not present

## 2022-06-16 DIAGNOSIS — L2089 Other atopic dermatitis: Secondary | ICD-10-CM | POA: Diagnosis not present

## 2022-06-16 DIAGNOSIS — H1010 Acute atopic conjunctivitis, unspecified eye: Secondary | ICD-10-CM

## 2022-06-16 MED ORDER — ALBUTEROL SULFATE (2.5 MG/3ML) 0.083% IN NEBU: INHALATION_SOLUTION | RESPIRATORY_TRACT | 4 refills | Status: DC

## 2022-06-16 MED ORDER — MOMETASONE FUROATE 0.1 % EX OINT: TOPICAL_OINTMENT | Freq: Every day | CUTANEOUS | 3 refills | Status: AC

## 2022-06-16 MED ORDER — ARNUITY ELLIPTA 200 MCG/ACT IN AEPB: INHALATION_SPRAY | RESPIRATORY_TRACT | 2 refills | Status: DC

## 2022-06-16 MED ORDER — FLUTICASONE PROPIONATE 50 MCG/ACT NA SUSP: NASAL | 1 refills | Status: DC

## 2022-06-16 MED ORDER — OLOPATADINE HCL 0.1 % OP SOLN: OPHTHALMIC | 4 refills | Status: DC

## 2022-06-16 MED ORDER — ALBUTEROL SULFATE HFA 108 (90 BASE) MCG/ACT IN AERS: INHALATION_SPRAY | RESPIRATORY_TRACT | 2 refills | Status: DC

## 2022-06-16 MED ORDER — OLOPATADINE HCL 0.2 % OP SOLN: 1.0000 [drp] | Freq: Every day | OPHTHALMIC | 5 refills | Status: DC

## 2022-06-16 MED ORDER — MONTELUKAST SODIUM 10 MG PO TABS: 10.0000 mg | ORAL_TABLET | Freq: Every day | ORAL | 2 refills | Status: DC
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
## 2022-06-16 NOTE — Patient Instructions (Addendum)
  1. Continue to Treat inflammation:    A. Arnuity 200- 1 inhalation 3-7 times a week  B. Flonase 1-2 sprays each nostril 3-7 times a week  C. Montelukast 10 mg - 1 tablet 3-7 times a week  2. If needed:   A. Proventil HFA 2 puffs every 4-6 hours  B. loratadine 10 mg 1- 2 tablets one time per day  C. Pataday 1 drop each eye once a day  D.  Mometasone 0.1% ointment applied to inflamed skin 1-2 times per day  3. For this recent flareup of skin and airway use prednisone 10 mg 1 tablet 1 time per day for 10 days only  4. Obtain fall flu vaccine  5. Return to clinic in 12 months or earlier if problem   6.  Hemoglobin A1c = 5.7 on 22 July 2021.

## 2022-06-17 ENCOUNTER — Encounter: Payer: Self-pay | Admitting: Allergy and Immunology

## 2022-06-18 ENCOUNTER — Other Ambulatory Visit (INDEPENDENT_AMBULATORY_CARE_PROVIDER_SITE_OTHER): Payer: Self-pay | Admitting: Pediatric Endocrinology

## 2022-06-18 ENCOUNTER — Other Ambulatory Visit: Payer: Self-pay

## 2022-06-18 DIAGNOSIS — E038 Other specified hypothyroidism: Secondary | ICD-10-CM

## 2022-06-18 MED ORDER — OLOPATADINE HCL 0.2 % OP SOLN: 1.0000 [drp] | Freq: Every day | OPHTHALMIC | 5 refills | Status: DC

## 2022-07-03 ENCOUNTER — Other Ambulatory Visit (INDEPENDENT_AMBULATORY_CARE_PROVIDER_SITE_OTHER): Payer: Self-pay | Admitting: Pediatric Endocrinology

## 2022-07-03 DIAGNOSIS — E038 Other specified hypothyroidism: Secondary | ICD-10-CM

## 2022-07-06 ENCOUNTER — Telehealth (INDEPENDENT_AMBULATORY_CARE_PROVIDER_SITE_OTHER): Payer: Self-pay | Admitting: Pediatric Endocrinology

## 2022-07-06 DIAGNOSIS — E038 Other specified hypothyroidism: Secondary | ICD-10-CM

## 2022-07-06 NOTE — Telephone Encounter (Signed)
  Name of who is calling: Gerri Lins Relationship to Patient: Self  Best contact number:  Provider they see:  Reason for call: Patient is calling to make an appt before heading to college Aug14. Also need a refill for prescription.     PRESCRIPTION REFILL ONLY  Name of prescription: levothyroxine (Synthroid)  Pharmacy:

## 2022-07-07 ENCOUNTER — Other Ambulatory Visit (INDEPENDENT_AMBULATORY_CARE_PROVIDER_SITE_OTHER): Payer: Self-pay

## 2022-07-07 DIAGNOSIS — E038 Other specified hypothyroidism: Secondary | ICD-10-CM

## 2022-07-07 MED ORDER — LEVOTHYROXINE SODIUM 150 MCG PO TABS: 150.0000 ug | ORAL_TABLET | Freq: Every day | ORAL | 0 refills | Status: DC

## 2022-07-08 ENCOUNTER — Telehealth (INDEPENDENT_AMBULATORY_CARE_PROVIDER_SITE_OTHER): Payer: Self-pay | Admitting: Pediatric Endocrinology

## 2022-07-08 NOTE — Telephone Encounter (Signed)
Who's calling (name and relationship to patient) : Renly Roots; self  Best contact number: [phone]  Provider they see: Dr. Vanessa [place]  Reason for call: He wanted to know if he could get a refill for  Synthroid until his next appt.   Call ID:      PRESCRIPTION REFILL ONLY  Name of prescription:  Pharmacy:

## 2022-08-16 ENCOUNTER — Other Ambulatory Visit (INDEPENDENT_AMBULATORY_CARE_PROVIDER_SITE_OTHER): Payer: Self-pay | Admitting: Pediatric Endocrinology

## 2022-08-16 DIAGNOSIS — E038 Other specified hypothyroidism: Secondary | ICD-10-CM

## 2022-09-17 ENCOUNTER — Ambulatory Visit (INDEPENDENT_AMBULATORY_CARE_PROVIDER_SITE_OTHER): Payer: 59 | Admitting: Pediatric Endocrinology

## 2022-09-17 ENCOUNTER — Encounter (INDEPENDENT_AMBULATORY_CARE_PROVIDER_SITE_OTHER): Payer: Self-pay

## 2022-10-12 ENCOUNTER — Other Ambulatory Visit (INDEPENDENT_AMBULATORY_CARE_PROVIDER_SITE_OTHER): Payer: Self-pay | Admitting: Pediatric Endocrinology

## 2022-10-12 DIAGNOSIS — E063 Autoimmune thyroiditis: Secondary | ICD-10-CM

## 2022-10-13 ENCOUNTER — Telehealth (INDEPENDENT_AMBULATORY_CARE_PROVIDER_SITE_OTHER): Payer: Self-pay

## 2022-10-24 ENCOUNTER — Other Ambulatory Visit (INDEPENDENT_AMBULATORY_CARE_PROVIDER_SITE_OTHER): Payer: Self-pay | Admitting: Pediatric Endocrinology

## 2022-10-24 ENCOUNTER — Other Ambulatory Visit: Payer: Self-pay | Admitting: Allergy and Immunology

## 2022-10-24 ENCOUNTER — Other Ambulatory Visit: Payer: Self-pay | Admitting: Family Medicine

## 2022-10-24 DIAGNOSIS — E038 Other specified hypothyroidism: Secondary | ICD-10-CM

## 2022-10-30 ENCOUNTER — Other Ambulatory Visit: Payer: Self-pay | Admitting: Allergy and Immunology

## 2022-11-18 ENCOUNTER — Other Ambulatory Visit (INDEPENDENT_AMBULATORY_CARE_PROVIDER_SITE_OTHER): Payer: Self-pay | Admitting: Pediatric Endocrinology

## 2022-11-18 DIAGNOSIS — E038 Other specified hypothyroidism: Secondary | ICD-10-CM

## 2022-11-24 ENCOUNTER — Ambulatory Visit (INDEPENDENT_AMBULATORY_CARE_PROVIDER_SITE_OTHER): Payer: 59 | Admitting: Pediatric Endocrinology

## 2022-11-24 ENCOUNTER — Encounter (INDEPENDENT_AMBULATORY_CARE_PROVIDER_SITE_OTHER): Payer: Self-pay | Admitting: Pediatric Endocrinology

## 2022-11-24 VITALS — BP 128/70 | HR 84 | Ht 71.42 in | Wt 289.2 lb

## 2022-11-24 DIAGNOSIS — E038 Other specified hypothyroidism: Secondary | ICD-10-CM

## 2022-11-24 DIAGNOSIS — E039 Hypothyroidism, unspecified: Secondary | ICD-10-CM

## 2022-11-24 DIAGNOSIS — E669 Obesity, unspecified: Secondary | ICD-10-CM

## 2022-11-24 DIAGNOSIS — Z6839 Body mass index (BMI) 39.0-39.9, adult: Secondary | ICD-10-CM

## 2022-11-24 DIAGNOSIS — R7303 Prediabetes: Secondary | ICD-10-CM | POA: Diagnosis not present

## 2022-11-24 DIAGNOSIS — E063 Autoimmune thyroiditis: Secondary | ICD-10-CM

## 2022-11-24 DIAGNOSIS — I10 Essential (primary) hypertension: Secondary | ICD-10-CM

## 2022-11-24 MED ORDER — LEVOTHYROXINE SODIUM 150 MCG PO TABS: 150.0000 ug | ORAL_TABLET | Freq: Every day | ORAL | 3 refills | Status: DC

## 2022-11-24 MED ORDER — LISINOPRIL 5 MG PO TABS: 5.0000 mg | ORAL_TABLET | Freq: Every day | ORAL | 3 refills | Status: AC

## 2022-11-24 NOTE — Progress Notes (Signed)
Subjective:  Subjective  Patient Name: Justin [name] Date of Birth: [birth year]  MRN: [id number]  Justin MinorsRobert [name]  presents to clinic today for follow-up evaluation and management  of his hypothyroidism, prediabetes, and obesity  HISTORY OF PRESENT ILLNESS:   [name] is a 21 y.o. Caucasian male .  [name] was unaccompanied   1. Justin [name] was first seen in our clinic 07/16/11 for evaluation and management of hypothyroidism and obesity. The patient was 8-11[birth year]21 years old. The patient had developed obesity gradually, but progressively for several years. Breast tissue has developed more recently. He has also had problems with excess hunger and reflux. He is taking both Riomet and Prevacid. Dr. Hosie PoissonSumner diagnosed him with hypothyroidism in March 2012 and started him on  levothyroxine, 25 mcg/day on 03/03/11. The child has not had thyroid surgery or neck irradiation. He has had problems with asthma and allergies for many years. His sister has similar problems with obesity, dyspepsia, GERD, and hypothryoidism secondary to thyroiditis.  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  It was replaced February 07, 2015. It was removed on 10/26/16.     2. The patient's last [facility] visit was on 07/22/21.  In the interim, he has been generally healthy.  He is at [employer] studying business and history. He is also very involved in [employer]. He is playing as a Conservator, museum/gallerygoalie and working out with a Psychologist, educationaltrainer there. He has shed about 40 pounds and is working on flexibility, agility, speed, and maintaining his muscle strength.   He has continued on Levothyroxine 150 mcg daily. He has not had any issues with remembering to take his doses.   He has also also continued on Pantoprazole.   Energy level is good.   Blood pressure - He is taking Lisinopril daily  He feels that it is working.  - He does not get light headed or  dizzy.   Prediabetes - No longer taking Metformin - He is drinking lots of water - Beer about once a week.   Eczema - Saw derm yesterday  - Got a steroid shot and steroid cream - He says that his skin looks much better today.   3. Pertinent Review of Systems:   Constitutional: The patient feels "good". The patient seems healthy and active.  Eyes: Vision seems to be good. There are no recognized eye problems. Ophthalmology evaluation (07/22/21) with retinal imaging- no issues.  Neck: There are no recognized problems of the anterior neck.  Heart: There are no recognized heart problems. The ability to play and do other physical activities seems normal.  Lungs: Asthma well controlled.  Gastrointestinal: Bowel movents seem normal. There are no recognized GI problems.  Legs: Muscle mass and strength seem normal. The child can play and perform other physical activities without obvious discomfort. No edema is noted. Growing pains occasionally   Feet: There are no obvious foot problems. No edema is noted. Neurologic: There are no recognized problems with muscle movement and strength, sensation, or coordination.    PAST MEDICAL, FAMILY, AND SOCIAL HISTORY  Past Medical History:  Diagnosis Date   Asthma    daily inhaler, prn neb./inhaler   Eczema    Family history of adverse reaction to anesthesia    pt's father had hx. of being hard to wake up post-op   Hashimoto's disease    Hypertension    under control with med.,  has been on med. x 1 yr.   Obesity    Precocious puberty 10/2016   Prediabetes    Sinus infection 10/20/2016   started antibiotic 10/14/2016 x 10 days    Family History  Problem Relation Age of Onset   Diabetes Mother    Hypertension Father    Anesthesia problems Father        hard to wake up post-op   Asthma Father    Colon cancer Father    Asthma Sister    Heart disease Maternal Grandfather    Asthma Brother    Heart disease Maternal Grandmother    Heart  disease Paternal Grandmother    Heart disease Paternal Grandfather    Liver disease Neg Hx    Esophageal cancer Neg Hx    Pancreatic cancer Neg Hx    Stomach cancer Neg Hx      Current Outpatient Medications:    albuterol (PROAIR HFA) 108 (90 Base) MCG/ACT inhaler, USE 2 INHALATIONS BY MOUTH  EVERY 4 HOURS AS NEEDED FOR WHEEZING OR SHORTNESS OF  BREATH, Disp: 54 g, Rfl: 2   albuterol (PROVENTIL) (2.5 MG/3ML) 0.083% nebulizer solution, USE 1 VIAL VIA NEBULIZER  EVERY 6 HOURS AS NEEDED FOR WHEEZING OR SHORTNESS OF  BREATH, Disp: 450 mL, Rfl: 4   Azelastine HCl 137 MCG/SPRAY SOLN, USE 2 SPRAYS IN BOTH NOSTRILS  TWICE DAILY, Disp: 120 mL, Rfl: 2   calcium-vitamin D (OSCAL WITH D) 500-200 MG-UNIT tablet, Take 1 tablet by mouth daily., Disp: , Rfl:    desonide (DESOWEN) 0.05 % cream, APPLY TOPICALLY TWICE DAILY, Disp: 180 g, Rfl: 1   fluticasone (FLONASE) 50 MCG/ACT nasal spray, USE 1 TO 2 SPRAYS IN BOTH  NOSTRILS ONCE DAILY AS  DIRECTED, Disp: 48 g, Rfl: 1   Fluticasone Furoate (ARNUITY ELLIPTA) 200 MCG/ACT AEPB, USE 1 INHALATION BY MOUTH  DAILY USE AS DIRECTED BY  DR. Lucie Leather, Disp: 90 each, Rfl: 2   levothyroxine (SYNTHROID) 150 MCG tablet, TAKE 1 TABLET BY MOUTH DAILY, Disp: 30 tablet, Rfl: 1   lisinopril (ZESTRIL) 5 MG tablet, TAKE 1 TABLET BY MOUTH  DAILY, Disp: 90 tablet, Rfl: 3   loratadine (CLARITIN) 10 MG tablet, TAKE 1 TABLET(10 MG) BY MOUTH TWICE DAILY, Disp: 180 tablet, Rfl: 1   loteprednol (LOTEMAX) 0.2 % SUSP, Place 1 drop into both eyes 2 (two) times daily., Disp: 3 Bottle, Rfl: 2   mometasone (ELOCON) 0.1 % ointment, Apply topically daily. Apply 1-2 times a day as needed, Disp: 45 g, Rfl: 3   mometasone (NASONEX) 50 MCG/ACT nasal spray, 2 sprays daily., Disp: , Rfl:    montelukast (SINGULAIR) 10 MG tablet, Take 1 tablet (10 mg total) by mouth at bedtime., Disp: 90 tablet, Rfl: 2   Olopatadine HCl (PATADAY) 0.2 % SOLN, Place 1 drop into both eyes daily., Disp: 2.5 mL, Rfl: 5    pantoprazole (PROTONIX) 40 MG tablet, Take 1 tablet (40 mg total) by mouth daily., Disp: 90 tablet, Rfl: 1  Allergies as of 11/24/2022 - Review Complete 11/24/2022  Allergen Reaction Noted   Latex  11/11/2021   Adhesive [tape] Rash 10/20/2016     reports that he has never smoked. He has never used smokeless tobacco. He reports that he does not drink alcohol and does not use drugs. 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     Objective:  Objective  Vital Signs:   BP 128/70 (BP Location: Left Arm, Patient Position: Sitting, Cuff Size: Large)   Pulse 84   Ht 5' 11.42" (1.814 m)   Wt 289 lb 3.2 oz (131.2 kg)   BMI 39.87 kg/m   Growth %ile SmartLinks can only be used for patients less than 8 years old.  Ht Readings from Last 3 Encounters:  11/24/22 5' 11.42" (1.814 m)  06/16/22 6\' 1"  (1.854 m) (89 %, Z= 1.21)*  06/24/21 5\' 11"  (1.803 m) (70 %, Z= 0.53)*   * Growth percentiles are based on CDC (Boys, 2-20 Years) data.   Wt Readings from Last 3 Encounters:  11/24/22 289 lb 3.2 oz (131.2 kg)  06/16/22 (!) 304 lb 9.6 oz (138.2 kg) (>99 %, Z= 3.12)*  07/22/21 (!) 327 lb 6.4 oz (148.5 kg) (>99 %, Z= 3.30)*   * Growth percentiles are based on CDC (Boys, 2-20 Years) data.   HC Readings from Last 3 Encounters:  No data found for [facility]   Body surface area is 2.57 meters squared.  Facility age limit for growth %iles is 20 years. Facility age limit for growth %iles is 20 years. Facility age limit for growth %iles is 20 years.   PHYSICAL EXAM:    Constitutional: The patient appears healthy and well nourished. The patient's height and weight are  advanced for age. He has lost about 38 pounds since last visit.  Head: The head is normocephalic. Face: The face appears normal. There are no obvious dysmorphic features. Eyes: The eyes appear to be normally formed and spaced. Gaze is conjugate. There is no obvious arcus or proptosis. Moisture appears normal. Ears: The ears are normally placed and appear externally normal. Mouth: The oropharynx and tongue appear normal. Dentition appears to be advanced for age. Oral moisture is normal. Neck: The neck appears to be visibly normal. The thyroid gland is 14 grams in size. The consistency of the thyroid gland is normal. The thyroid gland is not tender to palpation. Lungs: The lungs are clear to auscultation. Air movement is good. Heart: Heart rate and rhythm are regular. Heart sounds S1 and S2 are normal. I did not appreciate any pathologic cardiac murmurs. Abdomen: The abdomen appears to be large in size for the patient's age. Bowel sounds are normal. There is no obvious hepatomegaly, splenomegaly, or other mass effect. + stretch marks Arms: Muscle size and bulk are normal for age. Hands: There is no obvious tremor. Phalangeal and metacarpophalangeal joints are normal. Palmar muscles are normal for age. Palmar skin is normal. Palmar moisture is also normal. Legs: Muscles appear normal for age. No edema is present. Feet: Feet are normally formed. Dorsalis pedal pulses are normal. Neurologic: Strength is normal for age in both the upper and lower extremities. Muscle tone is normal. Sensation to touch is normal in both the legs and feet.     LAB DATA:     Lab Results  Component Value Date   HGBA1C 5.7 (A) 07/22/2021   HGBA1C 5.6 11/07/2020   HGBA1C 5.7 (H) 07/09/2020   HGBA1C 5.5 07/06/2019   HGBA1C 5.9 (H) 11/23/2018   HGBA1C 5.9 (H) 08/09/2018   HGBA1C 5.8 04/05/2018   HGBA1C 5.9 (H) 11/23/2017    No results found for this or any  previous visit (from the past 672 hour(s)).        Assessment and Plan:  Assessment  ASSESSMENT: Inigo is a 21 y.o. Caucasian young man who is followed for hypothyroidism, precocious puberty (s/p treatment with Supprelin) and elevated A1C. He has also been managed for hypertension.    Prediabetes/obesity - Not currently on Metformin - A1C with labs today - Have previously discussed restarting Metformin if A1C >6% - Now working with a trainer at school  - Significant weight reduction with increased aerobic activity.   Hypothyroidism - Clinically euthyroid - Continues on 150 mcg of Synthroid (generic) daily - repeat labs today  Hypertension - BP well controlled on Lisinopril 5mg    PLAN:    1. Diagnostic: Lab Orders         TSH         T4, free         Hemoglobin A1c     Referral Orders         Ambulatory referral to Endocrinology    Dr. for adult endocrinology (This is who mom sees)  2. Therapeutic: Continue Lisinopril.  Conitnue Synthroid 150 mcg daily.  3. Patient education: Reviewed changes since last visit. Discussed transition to adult care 4. Follow-up: No follow-ups on file. No follow up needed here. Referral to adult endocrine placed.   Lafe Garin, MD     Level of Service: >30 minutes spent today reviewing the medical chart, counseling the patient/family, and documenting today's encounter.

## 2022-11-25 ENCOUNTER — Other Ambulatory Visit: Payer: Self-pay

## 2022-11-25 LAB — T4, FREE: Free T4: 1.8 ng/dL — ABNORMAL HIGH (ref 0.8–1.4)

## 2022-11-25 LAB — HEMOGLOBIN A1C
Hgb A1c MFr Bld: 5.7 % of total Hgb — ABNORMAL HIGH (ref <5.7)
Mean Plasma Glucose: 117 mg/dL
eAG (mmol/L): 6.5 mmol/L

## 2022-11-25 LAB — TSH: TSH: 2.15 mIU/L (ref 0.40–4.50)

## 2022-11-25 MED ORDER — LORATADINE 10 MG PO TABS: ORAL_TABLET | ORAL | 3 refills | Status: DC

## 2022-11-26 ENCOUNTER — Other Ambulatory Visit: Payer: Self-pay

## 2022-11-26 ENCOUNTER — Encounter: Payer: Self-pay | Admitting: Allergy & Immunology

## 2022-11-26 ENCOUNTER — Ambulatory Visit (INDEPENDENT_AMBULATORY_CARE_PROVIDER_SITE_OTHER): Payer: 59 | Admitting: Allergy & Immunology

## 2022-11-26 VITALS — BP 150/60 | HR 111 | Temp 98.7°F | Resp 20 | Ht 70.5 in | Wt 289.6 lb

## 2022-11-26 DIAGNOSIS — K219 Gastro-esophageal reflux disease without esophagitis: Secondary | ICD-10-CM

## 2022-11-26 DIAGNOSIS — J4540 Moderate persistent asthma, uncomplicated: Secondary | ICD-10-CM | POA: Diagnosis not present

## 2022-11-26 DIAGNOSIS — J3089 Other allergic rhinitis: Secondary | ICD-10-CM | POA: Diagnosis not present

## 2022-11-26 DIAGNOSIS — L2089 Other atopic dermatitis: Secondary | ICD-10-CM | POA: Diagnosis not present

## 2022-11-26 MED ORDER — OLOPATADINE HCL 0.2 % OP SOLN: 1.0000 [drp] | Freq: Every day | OPHTHALMIC | 5 refills | Status: AC

## 2022-11-26 MED ORDER — MONTELUKAST SODIUM 10 MG PO TABS: 10.0000 mg | ORAL_TABLET | Freq: Every day | ORAL | 2 refills | Status: DC

## 2022-11-26 MED ORDER — DESONIDE 0.05 % EX CREA: TOPICAL_CREAM | Freq: Two times a day (BID) | CUTANEOUS | 1 refills | Status: AC

## 2022-11-26 MED ORDER — LORATADINE 10 MG PO TABS: ORAL_TABLET | ORAL | 3 refills | Status: DC

## 2022-11-26 MED ORDER — ALBUTEROL SULFATE HFA 108 (90 BASE) MCG/ACT IN AERS: 2.0000 | INHALATION_SPRAY | Freq: Four times a day (QID) | RESPIRATORY_TRACT | 2 refills | Status: DC | PRN

## 2022-11-26 MED ORDER — FLUTICASONE FUROATE-VILANTEROL 200-25 MCG/ACT IN AEPB: 1.0000 | INHALATION_SPRAY | Freq: Every day | RESPIRATORY_TRACT | 5 refills | Status: AC

## 2022-11-26 MED ORDER — AZELASTINE HCL 137 MCG/SPRAY NA SOLN: 1.0000 | Freq: Two times a day (BID) | NASAL | 2 refills | Status: DC | PRN

## 2022-11-26 NOTE — Patient Instructions (Addendum)
1. Moderate persistent asthma, uncomplicated - Lung testing not done. - We are going to increase your inhaler from Arnuity to [facility] (contains a long acting albuterol and a long acting steroid).  - Try changing the Breo to night time dosing to see if this can help for longer periods of times.  - Daily controller medication(s): Breo 200/66mcg one puff once daily - Prior to physical activity: albuterol 2 puffs 10-15 minutes before physical activity. - Rescue medications: albuterol 4 puffs every 4-6 hours as needed - Asthma control goals:  * Full participation in all desired activities (may need albuterol before activity) * Albuterol use two time or less a week on average (not counting use with activity) * Cough interfering with sleep two time or less a month * Oral steroids no more than once a year * No hospitalizations  2. Perennial allergic rhinitis - Continue with loratadine 10mg  daily.   3. Flexural atopic dermatitis - Agree with clobetasol to use for the hand.   4. Return in about 6 months (around 05/28/2023).    Please inform 05/30/2023 of any Emergency Department visits, hospitalizations, or changes in symptoms. Call us before going to the ED for breathing or allergy symptoms since we might be able to fit you in for a sick visit. Feel free to contact us anytime with any questions, problems, or concerns.  It was a pleasure to meet you today!  Websites that have reliable patient information: 1. American Academy of Asthma, Allergy, and Immunology: www.aaaai.org 2. Food Allergy Research and Education (FARE): foodallergy.org 3. Mothers of Asthmatics: http://www.asthmacommunitynetwork.org 4. American College of Allergy, Asthma, and Immunology: www.acaai.org   COVID-19 Vaccine Information can be found at: [place] For questions related to vaccine distribution or appointments, please email [email or web address] or call  [phone].   We realize that you might be concerned about having an allergic reaction to the COVID19 vaccines. To help with that concern, WE ARE OFFERING THE COVID19 VACCINES IN OUR OFFICE! Ask the front desk for dates!     "Like" [phone] on Facebook and Instagram for our latest updates!      A healthy democracy works best when [place] participate! Make sure you are registered to vote! If you have moved or changed any of your contact information, you will need to get this updated before voting!  In some cases, you MAY be able to register to vote online: [employer]

## 2022-11-26 NOTE — Progress Notes (Signed)
FOLLOW UP  Date of Service/Encounter:  11/26/22   Assessment:   Mild persistent asthma, uncomplicated - with continued symptoms, therefore we are going to increase to Breo from Arnuity  Perennial allergic rhinitis  Atopic dermatitis  GERD - on PPI  Plan/Recommendations:   1. Moderate persistent asthma, uncomplicated - Lung testing not done. - We are going to increase your inhaler from Arnuity to  [facility] (contains a long acting albuterol and a long acting steroid).  - Try changing the Breo to night time dosing to see if this can help for longer periods of times.  - Daily controller medication(s): Breo 200/50mcg one puff once daily - Prior to physical activity: albuterol 2 puffs 10-15 minutes before physical activity. - Rescue medications: albuterol 4 puffs every 4-6 hours as needed - Asthma control goals:  * Full participation in all desired activities (may need albuterol before activity) * Albuterol use two time or less a week on average (not counting use with activity) * Cough interfering with sleep two time or less a month * Oral steroids no more than once a year * No hospitalizations  2. Perennial allergic rhinitis - Continue with loratadine 10mg  daily.   3. Flexural atopic dermatitis - Agree with clobetasol to use for the hand.   4. Return in about 6 months (around 05/28/2023).   Subjective:   Justin [name] is a 21 y.o. male presenting today for follow up of  Chief Complaint  Patient presents with   Follow-up   Asthma    Pt states he has a cough that starts during the night and last thought the night while sleeping. And in the morning as soon as he wakes up. 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  Justin [name] is a 21 y.o. male presenting for a follow up visit.  He was last seen in July 2023 by Dr. Neldon Mc.  At that time, he was continued on Arnuity 200 mcg 1 puff daily as well as Flonase and montelukast.  He has Proventil as well as loratadine and mometasone to use for flares.  He was given a dose of prednisone for recent skin flare.    Since last visit, he has done well.   Asthma/Respiratory Symptom History: He has been coughing at a lot at night for the past three months.  He is doing the Arnuity in the morning. He sleeps fine and he does not feel that his sleep quality is worse. But it is a deep asthmatic cough. His mother is the one who complains the most about this. He has been on Asmanex in the past. He has not been on Symbicort or Advair or any other combined ICS/LABA. Arnuity is relatively affordable.  Allergic Rhinitis Symptom History: He take loratadine daily for his allergies. Winter is a good time  of the year for his symptoms.  He has not needed antibiotics often at all.   Skin Symptom History: He did have to go see a Dermatologist recently. He was diagnosed with eczema years ago by Dr. Neldon Mc.  He was prescribed clobetasol to help clear this up. He got a steroid shot as well.   GERD Symptom History: He does have reflux and takes Protonix. He has been on that for one year or more. It did help the reflux symptoms.   He is home for break from college now. HE is getting a business degree.   Otherwise, there have been no changes to his past medical history, surgical history, family history, or social history.    Review of  Systems  Constitutional: Negative.  Negative for fever, malaise/fatigue and weight loss.  HENT: Negative.  Negative for congestion, ear discharge and ear pain.   Eyes:  Negative for pain, discharge and redness.  Respiratory:  Positive for cough. Negative for sputum production, shortness of breath and wheezing.   Cardiovascular: Negative.  Negative for chest pain and palpitations.  Gastrointestinal:  Negative for abdominal pain, heartburn, nausea and vomiting.  Skin: Negative.  Negative for itching and rash.  Neurological:  Negative for dizziness and headaches.  Endo/Heme/Allergies:  Positive for environmental allergies. Does not bruise/bleed easily.       Objective:   Blood pressure (!) 150/60, pulse (!) 111, temperature 98.7 F (37.1 C), resp. rate 20, height 5' 10.5" (1.791 m), weight 289 lb 9.6 oz (131.4 kg), SpO2 97 %. Body mass index is 40.97 kg/m.    Physical Exam Vitals reviewed.  Constitutional:      Appearance: He is well-developed.  HENT:     Head: Normocephalic and atraumatic.     Right Ear: Tympanic membrane, ear canal and external ear normal. No drainage, swelling or tenderness. Tympanic membrane is not injected, scarred, erythematous, retracted or bulging.     Left Ear: Tympanic membrane, ear canal and external ear normal. No drainage, swelling or tenderness. Tympanic membrane is not injected, scarred, erythematous, retracted or bulging.     Nose: No nasal deformity, septal deviation, mucosal edema or rhinorrhea.     Right Turbinates: Enlarged, swollen and pale.     Left Turbinates: Enlarged, swollen and pale.     Right Sinus: No maxillary sinus tenderness or frontal sinus tenderness.     Left Sinus: No maxillary sinus tenderness or frontal sinus tenderness.     Mouth/Throat:     Mouth: Mucous membranes are not pale and not dry.     Pharynx: Uvula midline.  Eyes:     General:        Right eye: No discharge.        Left eye: No discharge.      Conjunctiva/sclera: Conjunctivae normal.     Right eye: Right conjunctiva is not injected. No chemosis.    Left eye: Left conjunctiva is not injected. No chemosis.    Pupils: Pupils are equal, round, and reactive to light.  Cardiovascular:     Rate and Rhythm: Normal rate and regular rhythm.     Heart sounds: Normal heart sounds.  Pulmonary:     Effort: Pulmonary effort is normal. No tachypnea, accessory muscle usage or respiratory distress.     Breath sounds: Normal breath sounds. No wheezing, rhonchi or rales.  Chest:     Chest wall: No tenderness.  Abdominal:     Tenderness: There is no abdominal tenderness. There is no guarding or  rebound.  Lymphadenopathy:     Head:     Right side of head: No submandibular, tonsillar or occipital adenopathy.     Left side of head: No submandibular, tonsillar or occipital adenopathy.     Cervical: No cervical adenopathy.  Skin:    Coloration: Skin is not pale.     Findings: No abrasion, erythema, petechiae or rash. Rash is not papular, urticarial or vesicular.  Neurological:     Mental Status: He is alert.  Psychiatric:        Behavior: Behavior is cooperative.      Diagnostic studies:    Spirometry: results normal (FEV1: 5.04/107%, FVC: 5.84/104%, FEV1/FVC: 86%).    Spirometry consistent with normal pattern.    Allergy Studies: none        Malachi Bonds, MD  [facility]

## 2022-11-27 ENCOUNTER — Other Ambulatory Visit (INDEPENDENT_AMBULATORY_CARE_PROVIDER_SITE_OTHER): Payer: Self-pay | Admitting: Pediatric Endocrinology

## 2022-11-27 DIAGNOSIS — E038 Other specified hypothyroidism: Secondary | ICD-10-CM

## 2022-12-01 ENCOUNTER — Encounter: Payer: Self-pay | Admitting: Allergy & Immunology

## 2022-12-30 ENCOUNTER — Other Ambulatory Visit: Payer: Self-pay | Admitting: Allergy and Immunology

## 2023-01-24 ENCOUNTER — Other Ambulatory Visit: Payer: Self-pay | Admitting: Allergy and Immunology

## 2023-06-11 ENCOUNTER — Encounter (INDEPENDENT_AMBULATORY_CARE_PROVIDER_SITE_OTHER): Payer: Self-pay

## 2023-06-21 NOTE — Patient Instructions (Incomplete)
1. Moderate persistent asthma, uncomplicated - Daily controller medication(s): Breo 200/38mcg one puff once daily - Prior to physical activity: albuterol 2 puffs 10-15 minutes before physical activity. - Rescue medications: albuterol 4 puffs every 4-6 hours as needed - Asthma control goals:  * Full participation in all desired activities (may need albuterol before activity) * Albuterol use two time or less a week on average (not counting use with activity) * Cough interfering with sleep two time or less a month * Oral steroids no more than once a year * No hospitalizations  2. Perennial allergic rhinitis - Continue with loratadine 10mg  daily.  - Hold mometasone nasal spray for the next 5-7 days due to area in left nostril - Recommend using over the counter saline gel for nasal dryness -Start using azelastine nasal spray 1 spray in each nostril twice a day as needed for runny nose/drainage down throat  3. Flexural atopic dermatitis - Agree with clobetasol to use for the hand.   4. Schedule a follow up in 4-6 months or sooner if neeed

## 2023-06-22 ENCOUNTER — Other Ambulatory Visit: Payer: Self-pay

## 2023-06-22 ENCOUNTER — Encounter: Payer: Self-pay | Admitting: Family

## 2023-06-22 ENCOUNTER — Ambulatory Visit: Payer: BC Managed Care – PPO | Admitting: Family

## 2023-06-22 ENCOUNTER — Other Ambulatory Visit: Payer: Self-pay | Admitting: Allergy & Immunology

## 2023-06-22 VITALS — BP 130/60 | HR 105 | Temp 98.3°F | Resp 16 | Ht 70.47 in | Wt 281.2 lb

## 2023-06-22 DIAGNOSIS — K219 Gastro-esophageal reflux disease without esophagitis: Secondary | ICD-10-CM | POA: Diagnosis not present

## 2023-06-22 DIAGNOSIS — J4540 Moderate persistent asthma, uncomplicated: Secondary | ICD-10-CM | POA: Diagnosis not present

## 2023-06-22 DIAGNOSIS — J3089 Other allergic rhinitis: Secondary | ICD-10-CM | POA: Diagnosis not present

## 2023-06-22 DIAGNOSIS — L2089 Other atopic dermatitis: Secondary | ICD-10-CM | POA: Diagnosis not present

## 2023-06-22 MED ORDER — FLUTICASONE FUROATE-VILANTEROL 200-25 MCG/ACT IN AEPB: INHALATION_SPRAY | RESPIRATORY_TRACT | 5 refills | Status: DC

## 2023-06-22 MED ORDER — ALBUTEROL SULFATE HFA 108 (90 BASE) MCG/ACT IN AERS: 2.0000 | INHALATION_SPRAY | Freq: Four times a day (QID) | RESPIRATORY_TRACT | 1 refills | Status: DC | PRN

## 2023-06-22 MED ORDER — CLOBETASOL PROPIONATE 0.05 % EX OINT: TOPICAL_OINTMENT | CUTANEOUS | 3 refills | Status: DC

## 2023-06-22 MED ORDER — AZELASTINE HCL 137 MCG/SPRAY NA SOLN: NASAL | 1 refills | Status: AC

## 2023-06-22 MED ORDER — LORATADINE 10 MG PO TABS: ORAL_TABLET | ORAL | 5 refills | Status: DC

## 2023-06-22 NOTE — Progress Notes (Signed)
[address] [place] [address] Dept: [phone]  FOLLOW UP NOTE  Patient ID: Justin [name], male    DOB: July 09, 2001  Age: 22 y.o. MRN: [id number] Date of Office Visit: 06/22/2023  Assessment  Chief Complaint: Follow-up  HPI Justin [name] is a 22 year old male who presents today for follow-up of moderate persistent asthma, perennial allergic rhinitis, and atopic dermatitis.  He was last seen on November 26, 2022 by Dr. Dellis Anes.  He denies any new diagnosis or surgery since his last office visit.  Moderate persistent asthma: He reports that his asthma is tremendously better since switching to Breo 200 mcg 1 puff once a day.  He reports coughing due to nasal drainage down his throat.  He denies wheezing, tightness in chest, shortness of breath, and nocturnal awakenings due to breathing problems.  Since his last office visit he has not required any trips to the emergency room or urgent care due to breathing problems.  He reports right now that he is currently on prednisone for right ankle injury and has an upcoming appointment.  He reports that he almost never uses his albuterol inhaler.  Perennial allergic rhinitis: He reports that he has seasonal rhinorrhea, nasal congestion, and postnasal drip.  He also reports dry nostrils.  He is currently taking loratadine 10 mg daily and mometasone nasal spray on most days.  He does also have azelastine nasal spray that he is not using.  He reports that he has used the azelastine nasal spray in the past and it has helped with drainage.  He has not been treated for any sinus infections since we last saw him.  Atopic dermatitis is reported as doing good.  He reports his eczema typically flares on his hands and is worse on his right hand which is his dominant hand.  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  His eczema tends to flare on his knuckles and on the tips of his fingers near his cuticles.  He has not had any skin infections  since we last saw him.   Drug Allergies:  Allergies  Allergen Reactions   Latex    Adhesive [Tape] Rash    EXACERBATES ECZEMA    Review of Systems: Review of Systems  Constitutional:  Negative for chills and fever.  HENT:         Reports seasonal rhinorrhea, nasal congestion, and postnasal drip  Eyes:        Denies itchy watery eyes  Respiratory:  Positive for cough. Negative for shortness of breath and wheezing.        Reports cough due to postnasal drip and denies wheezing, tightness in chest, shortness of breath, and nocturnal awakenings due to breathing problems  Cardiovascular:  Negative for chest pain and palpitations.  Gastrointestinal:        Reports reflux is treated very well since taking pantoprazole daily  Skin:        Reports that his eczema tends to flare on his right hand where he works as a Midwife and has to wash his hands frequently  Neurological:  Negative for headaches.  Endo/Heme/Allergies:  Positive for environmental allergies.     Physical Exam: BP 130/60   Pulse (!) 105   Temp 98.3 F (36.8 C) (Temporal)   Resp 16   Ht 5' 10.47" (1.79 m)   Wt 281 lb 3.2 oz (127.6 kg)   SpO2 97%   BMI 39.81 kg/m    Physical  Exam Constitutional:      Appearance: Normal appearance.  HENT:     Head: Normocephalic and atraumatic.     Comments: Pharynx normal, eyes normal, ears normal, nose: Irritation with dried blood noted in left nostril.  Right nostril normal.    Right Ear: Tympanic membrane, ear canal and external ear normal.     Left Ear: Tympanic membrane, ear canal and external ear normal.     Mouth/Throat:     Mouth: Mucous membranes are moist.     Pharynx: Oropharynx is clear.  Eyes:     Conjunctiva/sclera: Conjunctivae normal.  Cardiovascular:     Rate and Rhythm: Regular rhythm.     Heart sounds: Normal heart sounds.  Pulmonary:     Effort: Pulmonary effort is normal.     Breath sounds: Normal breath sounds.     Comments: Lungs clear to  auscultation Musculoskeletal:     Cervical back: Neck supple.  Skin:    General: Skin is warm.     Comments: Erythema noted on right hand knuckles.  Dry skin with crack with no oozing, bleeding, or erythema noted at right ring finger region.  Neurological:     Mental Status: He is alert and oriented to person, place, and time.  Psychiatric:        Mood and Affect: Mood normal.        Behavior: Behavior normal.        Thought Content: Thought content normal.        Judgment: Judgment normal.     Diagnostics: FVC 6.00 L (106%), FEV1 5.22 L (110%).  Spirometry indicates normal spirometry.  Assessment and Plan: 1. Moderate persistent asthma, uncomplicated   2. Perennial allergic rhinitis   3. Other atopic dermatitis   4. Gastroesophageal reflux disease, unspecified whether esophagitis present     Meds ordered this encounter  Medications   albuterol (VENTOLIN HFA) 108 (90 Base) MCG/ACT inhaler    Sig: Inhale 2 puffs into the lungs every 6 (six) hours as needed for wheezing or shortness of breath.    Dispense:  8 g    Refill:  1   Azelastine HCl 137 MCG/SPRAY SOLN    Sig: Place 1 spray in each nostril twice a day as needed for runny nose/drainage down throat    Dispense:  30 mL    Refill:  1   clobetasol ointment (TEMOVATE) 0.05 %    Sig: Use 1 application sparingly twice a day as needed to red itchy area on hand.  Do not use on face, groin, neck, or region.  Avoid occlusion.  Do not use longer than 2 weeks in a row.    Dispense:  30 g    Refill:  3   fluticasone furoate-vilanterol (BREO ELLIPTA) 200-25 MCG/ACT AEPB    Sig: Inhale 1 puff daily to help prevent cough and wheeze.  Rinse mouth out afterwards.    Dispense:  28 each    Refill:  5   loratadine (CLARITIN) 10 MG tablet    Sig: Take 1 tablet once a day as needed for runny nose or itching    Dispense:  30 tablet    Refill:  5    Patient Instructions  1. Moderate persistent asthma, uncomplicated - Daily controller  medication(s): Breo 200/12mcg one puff once daily - Prior to physical activity: albuterol 2 puffs 10-15 minutes before physical activity. - Rescue medications: albuterol 4 puffs every 4-6 hours as needed - Asthma control goals:  * Full  participation in all desired activities (may need albuterol before activity) * Albuterol use two time or less a week on average (not counting use with activity) * Cough interfering with sleep two time or less a month * Oral steroids no more than once a year * No hospitalizations  2. Perennial allergic rhinitis - Continue with loratadine 10mg  daily.  - Hold mometasone nasal spray for the next 5-7 days due to area in left nostril - Recommend using over the counter saline gel for nasal dryness -Start using azelastine nasal spray 1 spray in each nostril twice a day as needed for runny nose/drainage down throat  3. Flexural atopic dermatitis - Agree with clobetasol to use for the hand.   4. Schedule a follow up in 4-6 months or sooner if neeed      Return in about 6 months (around 12/23/2023), or if symptoms worsen or fail to improve.    Thank you for the opportunity to care for this patient.  Please do not hesitate to contact me with questions.  Nehemiah Settle, FNP [facility]

## 2023-06-24 ENCOUNTER — Other Ambulatory Visit: Payer: Self-pay | Admitting: Allergy & Immunology

## 2023-09-19 ENCOUNTER — Other Ambulatory Visit (INDEPENDENT_AMBULATORY_CARE_PROVIDER_SITE_OTHER): Payer: Self-pay | Admitting: Pediatric Endocrinology

## 2023-09-19 DIAGNOSIS — E063 Autoimmune thyroiditis: Secondary | ICD-10-CM

## 2023-12-21 ENCOUNTER — Other Ambulatory Visit (INDEPENDENT_AMBULATORY_CARE_PROVIDER_SITE_OTHER): Payer: Self-pay | Admitting: Pediatrics

## 2023-12-21 DIAGNOSIS — E063 Autoimmune thyroiditis: Secondary | ICD-10-CM

## 2023-12-28 ENCOUNTER — Encounter (INDEPENDENT_AMBULATORY_CARE_PROVIDER_SITE_OTHER): Payer: Self-pay

## 2024-02-27 ENCOUNTER — Other Ambulatory Visit: Payer: Self-pay | Admitting: Family

## 2024-05-02 ENCOUNTER — Other Ambulatory Visit: Payer: Self-pay | Admitting: Family

## 2024-05-17 ENCOUNTER — Other Ambulatory Visit: Payer: Self-pay | Admitting: Allergy & Immunology

## 2024-07-10 ENCOUNTER — Encounter: Payer: Self-pay | Admitting: Internal Medicine

## 2024-07-10 ENCOUNTER — Ambulatory Visit: Payer: Self-pay | Admitting: Internal Medicine

## 2024-07-10 ENCOUNTER — Other Ambulatory Visit: Payer: Self-pay

## 2024-07-10 VITALS — BP 130/88 | HR 77 | Temp 98.2°F | Ht 72.5 in | Wt 304.0 lb

## 2024-07-10 DIAGNOSIS — J3089 Other allergic rhinitis: Secondary | ICD-10-CM | POA: Diagnosis not present

## 2024-07-10 DIAGNOSIS — L301 Dyshidrosis [pompholyx]: Secondary | ICD-10-CM | POA: Insufficient documentation

## 2024-07-10 DIAGNOSIS — J4540 Moderate persistent asthma, uncomplicated: Secondary | ICD-10-CM | POA: Diagnosis not present

## 2024-07-10 MED ORDER — LORATADINE 10 MG PO TABS: ORAL_TABLET | ORAL | 5 refills | Status: AC

## 2024-07-10 MED ORDER — TRELEGY ELLIPTA 200-62.5-25 MCG/ACT IN AEPB: 1.0000 | INHALATION_SPRAY | Freq: Once | RESPIRATORY_TRACT | 11 refills | Status: AC

## 2024-07-10 MED ORDER — ALBUTEROL SULFATE HFA 108 (90 BASE) MCG/ACT IN AERS: 2.0000 | INHALATION_SPRAY | Freq: Four times a day (QID) | RESPIRATORY_TRACT | 1 refills | Status: AC | PRN

## 2024-07-10 MED ORDER — CLOBETASOL PROPIONATE 0.05 % EX OINT: TOPICAL_OINTMENT | CUTANEOUS | 3 refills | Status: AC

## 2024-07-10 NOTE — Patient Instructions (Addendum)
 1. Moderate persistent asthma, uncomplicated - Daily controller medication(s): Trelegy 200/62.5/25mcg one puff once daily - Prior to physical activity: albuterol  2 puffs 10-15 minutes before physical activity. - Rescue medications: albuterol  4 puffs every 4-6 hours as needed - Asthma control goals:  * Full participation in all desired activities (may need albuterol  before activity) * Albuterol  use two time or less a week on average (not counting use with activity) * Cough interfering with sleep two time or less a month * Oral steroids no more than once a year * No hospitalizations - consider Dupixent if asthma not controlled  2. Perennial allergic rhinitis - Continue with loratadine  10mg  daily as needed - mometasone  nasal spray 1 spray in each nostril twice daily as needed - Recommend using over the counter saline gel for nasal dryness - azelastine  nasal spray 1 spray in each nostril twice a day as needed for runny nose/drainage down throat  3. Flexural atopic dermatitis - clobetasol  0.05% twice daily as needed for flares on hands  4. Schedule a follow up in 6 months or sooner if neeed

## 2024-07-10 NOTE — Progress Notes (Signed)
 FOLLOW UP Date of Service/Encounter:   07/10/2024  Subjective:  Justin [name] (DOB: June 28, 2001) is a 23 y.o. male who [facility] in re-evaluation of the following: moderate persistent asthma, allergic rhinitis, dyshidrotic eczema History obtained from: chart review and patient.  For Review, LV was on 06/22/23  with Wanda Craze, FNP seen for routine follow-up. This is my first encoutner with this patient. He has been controlled on Breo 200 daily and loratadine  and nasal sprays as needed. He has hand dermatitis  Today presents for follow-up.  Discussed the use of AI scribe software for clinical note transcription with the patient, who gave verbal consent to proceed.  History of Present Illness Justin [name] is a 23 year old male with asthma who presents for management of asthma symptoms during lacrosse season.  Asthma symptoms and exacerbations - Increased asthma symptoms during lacrosse season, particularly in April and May when temperatures are higher - Uses Pulmicort inhaler before every practice for prevention during highly competitive activities - [employer] every morning; transitioned from Arnuity to [place] due to insurance changes - Uses albuterol  before bed during the summer due to increased allergies causing nasal congestion and cough - No emergency room or urgent care visits for asthma exacerbations - Used oral steroids once in the past year for respiratory infection - Occasional nebulizer use when ill, but not regularly - Receives medications through a mail-in service  Eczematous dermatitis of the hands - 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 No new pertinent medical or surgical history except as noted in HPI.  ROS: All others negative except as noted per HPI.   Objective:  BP 130/88   Pulse 77   Temp 98.2 F (36.8 C)   Ht 6' 0.5 (1.842 m)   Wt (!) 304 lb (137.9 kg)   SpO2 98%   BMI 40.66 kg/m  Body mass index is 40.66 kg/m. Physical Exam: General Appearance:  Alert, cooperative, no distress, appears stated age  Head:  Normocephalic, without obvious abnormality, atraumatic  Eyes:  Conjunctiva clear, EOM's intact  Ears EACs normal bilaterally and normal TMs bilaterally  Nose: Nares normal, hypertrophic turbinates, normal mucosa, and no visible anterior polyps  Throat: Lips, tongue normal; teeth and gums normal, normal posterior oropharynx  Neck: Supple, symmetrical  Lungs:   clear to auscultation bilaterally, Respirations unlabored, no coughing  Heart:  regular rate and rhythm and no murmur, Appears well perfused  Extremities: No edema  Skin: erythematous, dry patches scattered on left hand, third digit lateral side  Neurologic: No gross deficits   Labs:  Lab Orders  No laboratory test(s) ordered today    Spirometry:  Tracings reviewed. His effort: Good reproducible efforts. FVC: 5.82L FEV1: 5.18L, 102% predicted FEV1/FVC ratio: 0.89 Interpretation: Spirometry consistent with normal pattern.  Please see scanned spirometry results for details.   Assessment/Plan   1. Moderate persistent asthma, uncomplicated; not at goal,  1 round OCS in past year, using albuterol  prior to bed recently. - Daily controller medication(s): Trelegy 200/62.5/25mcg one puff once daily - Prior to physical activity: albuterol  2  puffs 10-15 minutes before physical activity. - Rescue medications: albuterol  4 puffs every 4-6 hours as needed - Asthma control goals:  * Full participation in all desired activities (may need albuterol  before activity) * Albuterol  use two time or less a week on average (not counting use with activity) * Cough interfering with sleep two time or less a month * Oral steroids no more than once a year * No hospitalizations - consider Dupixent if asthma not controlled  2. Perennial allergic rhinitis-at goal - Continue with loratadine  10mg  daily as needed - mometasone  nasal spray 1 spray in each nostril twice daily as needed - Recommend using over the counter saline gel for nasal dryness - azelastine  nasal spray 1 spray in each nostril twice a day as needed for runny nose/drainage down throat  3. Flexural atopic dermatitis-current flare - clobetasol  0.05% twice daily as needed for flares on hands  4. Schedule a follow up in 6 months or sooner if neeed  Other: 2 samples trelegy given  Rocky Endow, MD  [facility] 

## 2025-01-29 ENCOUNTER — Ambulatory Visit: Admitting: Endocrinology
